# Patient Record
Sex: Female | Born: 1965 | Race: White | Hispanic: No | Marital: Married | Smoking: Former smoker
Health system: Southern US, Community
[De-identification: ages and names within clinical notes are randomized; demographics above are authoritative.]

## PROBLEM LIST (undated history)

## (undated) DIAGNOSIS — F419 Anxiety disorder, unspecified: Secondary | ICD-10-CM

## (undated) DIAGNOSIS — E119 Type 2 diabetes mellitus without complications: Secondary | ICD-10-CM

## (undated) HISTORY — PX: BICEPS TENDON REPAIR: SHX566

## (undated) HISTORY — PX: HERNIA REPAIR: SHX51

## (undated) HISTORY — PX: CHOLECYSTECTOMY: SHX55

## (undated) HISTORY — PX: COLONOSCOPY: SHX174

## (undated) HISTORY — PX: ROTATOR CUFF REPAIR: SHX139

## (undated) HISTORY — PX: GASTRIC BYPASS: SHX52

---

## 2017-03-03 ENCOUNTER — Ambulatory Visit (INDEPENDENT_AMBULATORY_CARE_PROVIDER_SITE_OTHER): Payer: BLUE CROSS/BLUE SHIELD | Admitting: Orthopedic Surgery

## 2017-03-03 ENCOUNTER — Encounter (INDEPENDENT_AMBULATORY_CARE_PROVIDER_SITE_OTHER): Payer: Self-pay | Admitting: Orthopedic Surgery

## 2017-03-03 DIAGNOSIS — M79601 Pain in right arm: Secondary | ICD-10-CM | POA: Diagnosis not present

## 2017-03-03 MED ORDER — GABAPENTIN 100 MG PO CAPS: 100.0000 mg | ORAL_CAPSULE | Freq: Two times a day (BID) | ORAL | 0 refills | Status: DC

## 2017-03-03 MED ORDER — TRAMADOL HCL 50 MG PO TABS: 50.0000 mg | ORAL_TABLET | Freq: Two times a day (BID) | ORAL | 0 refills | Status: DC

## 2017-03-03 NOTE — Progress Notes (Signed)
Office Visit Note   Patient: Morgan Owens           Date of Birth: 03/04/1966           MRN: [id number] Visit Date: 03/03/2017 Requested by: No referring provider defined for this encounter. PCP: System, Pcp Not In  Subjective: Chief Complaint  Patient presents with  . Arm Pain    right arm    HPI: Morgan Manchesterraci is a 51 year old patient with right arm pain.  She reports numbness and tingling in all of her fingers.  Reports weakness in the hand is well including radiating arm pain.  Been going on for several weeks.  She tried a wrist splint without much relief.  She also tried ibuprofen Tylenol and Aleve without relief.:  Makes it worse but she does get some relief with heat.  She does have a history of tennis elbow but she states this doesn't feel like tennis elbow.  Putting her arm overhead gives her relief.  She has tenderness under her arm as well.  The pain makes her nauseated at times.  She describes primarily palmar sided numbness and tingling and doesn't really report any discrete neck pain.  She works as a Estate agentforklift operator.              ROS: All systems reviewed are negative as they relate to the chief complaint within the history of present illness.  Patient denies  fevers or chills.   Assessment & Plan: Visit Diagnoses:  1. Right arm pain     Plan: Impression is right sided radiculopathy which may be either carpal tunnel, cubital tunnel syndrome, or radicular pain from a far lateral disc herniation in her neck.  Plan at this time is to try her on Ultram twice a day #30 with no refills as well as Neurontin.  I informed her that was an off label use.  Refer to Morgan Owens for nerve conduction study to evaluate for carpal tunnel syndrome versus cubital tunnel syndrome versus cervical radiculopathy.  I'll see her back after those studies.  Follow-Up Instructions: No Follow-up on file.   Orders:  Orders Placed This Encounter  Procedures  . Ambulatory referral to Physical  Medicine Rehab   Meds ordered this encounter  Medications  . traMADol (ULTRAM) 50 MG tablet    Sig: Take 1 tablet (50 mg total) by mouth 2 (two) times daily.    Dispense:  30 tablet    Refill:  0  . gabapentin (NEURONTIN) 100 MG capsule    Sig: Take 1 capsule (100 mg total) by mouth 2 (two) times daily.    Dispense:  30 capsule    Refill:  0      Procedures: No procedures performed   Clinical Data: No additional findings.  Objective: Vital Signs: There were no vitals taken for this visit.  Physical Exam:   Constitutional: Patient appears well-developed HEENT:  Head: Normocephalic Eyes:EOM are normal Neck: Normal range of motion Cardiovascular: Normal rate Pulmonary/chest: Effort normal Neurologic: Patient is alert Skin: Skin is warm Psychiatric: Patient has normal mood and affect    Ortho Exam: Orthopedic exam demonstrates good cervical spine range of motion she does have some paresthesias in the ulnar distribution right versus left.  Positive Tinel's in the cubital tunnel and right versus left.  No subluxation of the ulnar nerve however with elbow flexion.  Radial pulses intact bilaterally.  Reflexes symmetric bilateral biceps and triceps 1+ out of 4.  She has pretty good  interosseous wrist flexion and wrist extension biceps triceps and deltoid strength but no wasting of the interosseous muscles.  Her interosseous strength however is slightly weaker on the right compared to the left.  Negative carpal tunnel compression testing bilaterally.  Specialty Comments:  No specialty comments available.  Imaging: No results found.   PMFS History: There are no active problems to display for this patient.  No past medical history on file.  No family history on file.  No past surgical history on file. Social History   Occupational History  . Not on file.   Social History Main Topics  . Smoking status: Not on file  . Smokeless tobacco: Not on file  . Alcohol use Not  on file  . Drug use: Unknown  . Sexual activity: Not on file

## 2017-03-22 ENCOUNTER — Ambulatory Visit (INDEPENDENT_AMBULATORY_CARE_PROVIDER_SITE_OTHER): Payer: BLUE CROSS/BLUE SHIELD | Admitting: Physical Medicine and Rehabilitation

## 2017-03-22 ENCOUNTER — Encounter (INDEPENDENT_AMBULATORY_CARE_PROVIDER_SITE_OTHER): Payer: Self-pay | Admitting: Physical Medicine and Rehabilitation

## 2017-03-22 DIAGNOSIS — M5412 Radiculopathy, cervical region: Secondary | ICD-10-CM

## 2017-03-22 DIAGNOSIS — R202 Paresthesia of skin: Secondary | ICD-10-CM | POA: Diagnosis not present

## 2017-03-22 NOTE — Progress Notes (Deleted)
Right upper extremity, tingling, middle and ring finger lock up, pain in forearm, hanging down makes it really go numb and tingling.  Using heating pads as it is the only thin that gives her relief. Right handed, No lotions

## 2017-03-22 NOTE — Progress Notes (Signed)
Owens Owens SaChambers Miller - 51 y.o. female MRN [id number]  Date of birth: 07/12/1966  Office Visit Note: Visit Date: 03/22/2017 PCP: System, Pcp Not In Referred by: No ref. provider found  Subjective: Chief Complaint  Patient presents with  . Right Arm - Pain, Numbness   HPI: Ms. Owens Owens is a 51 year old right-hand dominant female reporting several weeks of pain and numbness in the right arm essentially in all the digits but when he really get her to describe the symptoms in the hand is in the middle 2 digits. She does not endorse much in the way of neck pain. It does seem to radiate from the arm down to the hand. She does get worsening with certain positions and at night. She gets some relief she raises her arm above her head. She gets worsening symptoms with her arm hanging down. Anti-inflammatories without much relief. She initially tried a carpal tunnel brace without relief. She recently saw Dr. August Saucerean who ordered electrodiagnostic studies. She's not had prior electrodiagnostic studies. She's had no prior cervical MRI. She has had some lumbar spine problems with more recent lumbar spine MRI. She denies any symptoms on the left. She reports using heating pads. She started the tramadol and gabapentin without much relief. She works as a Estate agentforklift operator.    ROS Otherwise per HPI.  Assessment & Plan: Visit Diagnoses:  1. Paresthesia of skin   2. Cervical radiculopathy     Plan: No additional findings.  Impression: The above electrodiagnostic study is ABNORMAL and reveals evidence of moderate subacute on chronic C7 radiculopathy on the right.    There is also  evidence of a mild to almost moderate right median nerve entrapment at the wrist (carpal tunnel syndrome) affecting sensory components. This essentially represents a double crush phenomenon.  There is no significant electrodiagnostic evidence of any other focal nerve entrapment, brachial plexopathy or generalized peripheral  neuropathy.   Recommendations: 1.  Follow-up with referring physician. 2.  Continue current management of symptoms. 3.  Suggest MRI of the cervical spine.    Meds & Orders: No orders of the defined types were placed in this encounter.   Orders Placed This Encounter  Procedures  . MR CERVICAL SPINE WO CONTRAST  . NCV with EMG (electromyography)    Follow-up: Return for MRI review after completion.   Procedures: No procedures performed  EMG & NCV Findings: Evaluation of the right median (across palm) sensory nerve showed prolonged distal peak latency (Wrist, 4.1 ms).  All remaining nerves (as indicated in the following tables) were within normal limits.    Needle evaluation of the right first dorsal interosseous muscle showed increased insertional activity and slightly increased spontaneous activity.  The right extensor digitorum communis and the right pronator teres muscles showed increased insertional activity, increased spontaneous activity, and diminished recruitment.  The right triceps muscle showed increased insertional activity and diminished recruitment.  All remaining muscles (as indicated in the following table) showed no evidence of electrical instability.    Impression: The above electrodiagnostic study is ABNORMAL and reveals evidence of moderate subacute on chronic C7 radiculopathy on the right.    There is also  evidence of a mild to almost moderate right median nerve entrapment at the wrist (carpal tunnel syndrome) affecting sensory components. This essentially represents a double crush phenomenon.  There is no significant electrodiagnostic evidence of any other focal nerve entrapment, brachial plexopathy or generalized peripheral neuropathy.   Recommendations: 1.  Follow-up with referring physician. 2.  Continue  current management of symptoms. 3.  Suggest MRI of the cervical spine.    Nerve Conduction Studies Anti Sensory Summary Table   Stim Site NR Peak (ms)  Norm Peak (ms) P-T Amp (V) Norm P-T Amp Site1 Site2 Delta-P (ms) Dist (cm) Vel (m/s) Norm Vel (m/s)  Right Median Acr Palm Anti Sensory (2nd Digit)  33.1C  Wrist    *4.1 <3.6 18.9 >10 Wrist Palm 2.3 0.0    Palm    1.8 <2.0 5.0         Right Radial Anti Sensory (Base 1st Digit)  32.6C  Wrist    1.7 <3.1 21.8  Wrist Base 1st Digit 1.7 0.0    Right Ulnar Anti Sensory (5th Digit)  33C  Wrist    3.2 <3.7 25.4 >15.0 Wrist 5th Digit 3.2 14.0 44 >38   Motor Summary Table   Stim Site NR Onset (ms) Norm Onset (ms) O-P Amp (mV) Norm O-P Amp Site1 Site2 Delta-0 (ms) Dist (cm) Vel (m/s) Norm Vel (m/s)  Right Median Motor (Abd Poll Brev)  32.6C  Wrist    4.2 <4.2 5.5 >5 Elbow Wrist 4.2 21.0 50 >50  Elbow    8.4  4.7         Right Ulnar Motor (Abd Dig Min)  32.7C  Wrist    3.2 <4.2 9.4 >3 B Elbow Wrist 3.5 20.0 57 >53  B Elbow    6.7  8.6  A Elbow B Elbow 1.5 9.5 63 >53  A Elbow    8.2  8.1          EMG   Side Muscle Nerve Root Ins Act Fibs Psw Amp Dur Poly Recrt Int Dennie Bible Comment  Right 1stDorInt Ulnar C8-T1 *CRD *1+ *1+ Nml Nml 0 Nml Nml   Right Abd Poll Brev Median C8-T1 Nml Nml Nml Nml Nml 0 Nml Nml   Right ExtDigCom   *Incr *3+ *3+ Nml Nml 0 *Reduced Nml   Right Triceps Radial C6-7-8 *Incr Nml Nml Nml Nml 0 *Reduced Nml   Right Deltoid Axillary C5-6 Nml Nml Nml Nml Nml 0 Nml Nml   Right PronatorTeres Median C6-7 *Incr *3+ *3+ Nml Nml 0 *Reduced Nml     Nerve Conduction Studies Anti Sensory Left/Right Comparison   Stim Site L Lat (ms) R Lat (ms) L-R Lat (ms) L Amp (V) R Amp (V) L-R Amp (%) Site1 Site2 L Vel (m/s) R Vel (m/s) L-R Vel (m/s)  Median Acr Palm Anti Sensory (2nd Digit)  33.1C  Wrist  *4.1   18.9  Wrist Palm     Palm  1.8   5.0        Radial Anti Sensory (Base 1st Digit)  32.6C  Wrist  1.7   21.8  Wrist Base 1st Digit     Ulnar Anti Sensory (5th Digit)  33C  Wrist  3.2   25.4  Wrist 5th Digit  44    Motor Left/Right Comparison   Stim Site L Lat (ms) R Lat (ms) L-R  Lat (ms) L Amp (mV) R Amp (mV) L-R Amp (%) Site1 Site2 L Vel (m/s) R Vel (m/s) L-R Vel (m/s)  Median Motor (Abd Poll Brev)  32.6C  Wrist  4.2   5.5  Elbow Wrist  50   Elbow  8.4   4.7        Ulnar Motor (Abd Dig Min)  32.7C  Wrist  3.2   9.4  B Elbow Wrist  57  B Elbow  6.7   8.6  A Elbow B Elbow  63   A Elbow  8.2   8.1           Waveforms:            Clinical History: No specialty comments available.  She has an unknown smoking status. She has never used smokeless tobacco. No results for input(s): HGBA1C, LABURIC in the last 8760 hours.  Objective:  VS:  HT:    WT:   BMI:     BP:   HR: bpm  TEMP: ( )  RESP:  Physical Exam  Musculoskeletal:  Inspection reveals no atrophy of the bilateral APB or FDI or hand intrinsics. There is no swelling, color changes, allodynia or dystrophic changes. There is 5 out of 5 strength in the bilateral wrist extension, finger abduction and long finger flexion. There is subjective impaired sensation to light touch in the right C6 and C7 dermatome. There is a negative Tinel's test at the bilateral wrist and elbow. There is a negative Phalen's test bilaterally. There is a negative Hoffmann's test bilaterally.    Ortho Exam Imaging: No results found.  Past Medical/Family/Surgical/Social History: Medications & Allergies reviewed per EMR There are no active problems to display for this patient.  No past medical history on file. No family history on file. No past surgical history on file. Social History   Occupational History  . Not on file.   Social History Main Topics  . Smoking status: Unknown If Ever Smoked  . Smokeless tobacco: Never Used  . Alcohol use Not on file  . Drug use: Unknown  . Sexual activity: Not on file

## 2017-03-23 NOTE — Procedures (Signed)
EMG & NCV Findings: Evaluation of the right median (across palm) sensory nerve showed prolonged distal peak latency (Wrist, 4.1 ms).  All remaining nerves (as indicated in the following tables) were within normal limits.    Needle evaluation of the right first dorsal interosseous muscle showed increased insertional activity and slightly increased spontaneous activity.  The right extensor digitorum communis and the right pronator teres muscles showed increased insertional activity, increased spontaneous activity, and diminished recruitment.  The right triceps muscle showed increased insertional activity and diminished recruitment.  All remaining muscles (as indicated in the following table) showed no evidence of electrical instability.    Impression: The above electrodiagnostic study is ABNORMAL and reveals evidence of moderate subacute on chronic C7 radiculopathy on the right.    There is also  evidence of a mild to almost moderate right median nerve entrapment at the wrist (carpal tunnel syndrome) affecting sensory components. This essentially represents a double crush phenomenon.  There is no significant electrodiagnostic evidence of any other focal nerve entrapment, brachial plexopathy or generalized peripheral neuropathy.   Recommendations: 1.  Follow-up with referring physician. 2.  Continue current management of symptoms. 3.  Suggest MRI of the cervical spine.    Nerve Conduction Studies Anti Sensory Summary Table   Stim Site NR Peak (ms) Norm Peak (ms) P-T Amp (V) Norm P-T Amp Site1 Site2 Delta-P (ms) Dist (cm) Vel (m/s) Norm Vel (m/s)  Right Median Acr Palm Anti Sensory (2nd Digit)  33.1C  Wrist    *4.1 <3.6 18.9 >10 Wrist Palm 2.3 0.0    Palm    1.8 <2.0 5.0         Right Radial Anti Sensory (Base 1st Digit)  32.6C  Wrist    1.7 <3.1 21.8  Wrist Base 1st Digit 1.7 0.0    Right Ulnar Anti Sensory (5th Digit)  33C  Wrist    3.2 <3.7 25.4 >15.0 Wrist 5th Digit 3.2 14.0 44 >38    Motor Summary Table   Stim Site NR Onset (ms) Norm Onset (ms) O-P Amp (mV) Norm O-P Amp Site1 Site2 Delta-0 (ms) Dist (cm) Vel (m/s) Norm Vel (m/s)  Right Median Motor (Abd Poll Brev)  32.6C  Wrist    4.2 <4.2 5.5 >5 Elbow Wrist 4.2 21.0 50 >50  Elbow    8.4  4.7         Right Ulnar Motor (Abd Dig Min)  32.7C  Wrist    3.2 <4.2 9.4 >3 B Elbow Wrist 3.5 20.0 57 >53  B Elbow    6.7  8.6  A Elbow B Elbow 1.5 9.5 63 >53  A Elbow    8.2  8.1          EMG   Side Muscle Nerve Root Ins Act Fibs Psw Amp Dur Poly Recrt Int Dennie BiblePat Comment  Right 1stDorInt Ulnar C8-T1 *CRD *1+ *1+ Nml Nml 0 Nml Nml   Right Abd Poll Brev Median C8-T1 Nml Nml Nml Nml Nml 0 Nml Nml   Right ExtDigCom   *Incr *3+ *3+ Nml Nml 0 *Reduced Nml   Right Triceps Radial C6-7-8 *Incr Nml Nml Nml Nml 0 *Reduced Nml   Right Deltoid Axillary C5-6 Nml Nml Nml Nml Nml 0 Nml Nml   Right PronatorTeres Median C6-7 *Incr *3+ *3+ Nml Nml 0 *Reduced Nml     Nerve Conduction Studies Anti Sensory Left/Right Comparison   Stim Site L Lat (ms) R Lat (ms) L-R Lat (ms) L Amp (V) R  Amp (V) L-R Amp (%) Site1 Site2 L Vel (m/s) R Vel (m/s) L-R Vel (m/s)  Median Acr Palm Anti Sensory (2nd Digit)  33.1C  Wrist  *4.1   18.9  Wrist Palm     Palm  1.8   5.0        Radial Anti Sensory (Base 1st Digit)  32.6C  Wrist  1.7   21.8  Wrist Base 1st Digit     Ulnar Anti Sensory (5th Digit)  33C  Wrist  3.2   25.4  Wrist 5th Digit  44    Motor Left/Right Comparison   Stim Site L Lat (ms) R Lat (ms) L-R Lat (ms) L Amp (mV) R Amp (mV) L-R Amp (%) Site1 Site2 L Vel (m/s) R Vel (m/s) L-R Vel (m/s)  Median Motor (Abd Poll Brev)  32.6C  Wrist  4.2   5.5  Elbow Wrist  50   Elbow  8.4   4.7        Ulnar Motor (Abd Dig Min)  32.7C  Wrist  3.2   9.4  B Elbow Wrist  57   B Elbow  6.7   8.6  A Elbow B Elbow  63   A Elbow  8.2   8.1           Waveforms:

## 2017-04-02 ENCOUNTER — Ambulatory Visit
Admission: RE | Admit: 2017-04-02 | Discharge: 2017-04-02 | Disposition: A | Discharge status: Home / Self Care | Payer: Self-pay | Source: Ambulatory Visit | Attending: Physical Medicine and Rehabilitation | Admitting: Physical Medicine and Rehabilitation

## 2017-04-02 DIAGNOSIS — M5412 Radiculopathy, cervical region: Secondary | ICD-10-CM

## 2017-04-02 DIAGNOSIS — R202 Paresthesia of skin: Secondary | ICD-10-CM

## 2017-04-03 ENCOUNTER — Other Ambulatory Visit: Payer: Self-pay

## 2017-04-05 ENCOUNTER — Encounter (INDEPENDENT_AMBULATORY_CARE_PROVIDER_SITE_OTHER): Payer: Self-pay | Admitting: Orthopaedic Surgery

## 2017-04-05 ENCOUNTER — Ambulatory Visit (INDEPENDENT_AMBULATORY_CARE_PROVIDER_SITE_OTHER): Payer: BLUE CROSS/BLUE SHIELD

## 2017-04-05 ENCOUNTER — Ambulatory Visit (INDEPENDENT_AMBULATORY_CARE_PROVIDER_SITE_OTHER): Payer: BLUE CROSS/BLUE SHIELD | Admitting: Orthopaedic Surgery

## 2017-04-05 VITALS — BP 124/81 | HR 100 | Ht 68.0 in | Wt 211.0 lb

## 2017-04-05 DIAGNOSIS — M4802 Spinal stenosis, cervical region: Secondary | ICD-10-CM | POA: Diagnosis not present

## 2017-04-05 DIAGNOSIS — M542 Cervicalgia: Secondary | ICD-10-CM

## 2017-04-05 NOTE — Progress Notes (Signed)
Office Visit Note   Patient: Morgan Owens           Date of Birth: Dec 11, 1965           MRN: [id number] Visit Date: 04/05/2017              Requested by: No referring provider defined for this encounter. PCP: System, Pcp Not In   Assessment & Plan: Visit Diagnoses:  1. Cervical stenosis of spine   2. Neck pain     Plan: Patient has significant cervical stenosis with flattening of the cord down to 5 mm worse at C6-7 and also severe at 5 to 6 mm  at C5-6. Obtain a CT scan and see her back after the scan. I discussed with her likely need for corpectomy to be performed and fusion from C5 down to C7 with plate fixation. We'll see her back after the scan for review of degree of OPLL.   Follow-Up Instructions: after cervical CT scan.   Orders:  Orders Placed This Encounter  Procedures  . XR Cervical Spine 2 or 3 views  . CT CERVICAL SPINE WO CONTRAST   No orders of the defined types were placed in this encounter.     Procedures: No procedures performed   Clinical Data: No additional findings.   Subjective: Chief Complaint  Patient presents with  . Neck - Pain  . Right Arm - Numbness, Weakness    HPI patient has ongoing neck pain she's had some persistent problems with low back pain and was told she has some stenosis in the lumbar region. Electrical tests were abnormal and positive for moderate subacute on chronic C7 radiculopathy on the right. She also had some mild to moderate right carpal tunnel changes affecting sensory components.  Review of Systems  Constitutional: Negative for chills and diaphoresis.  HENT: Negative for ear discharge, ear pain and nosebleeds.   Eyes: Negative for discharge and visual disturbance.  Respiratory: Negative for cough, choking and shortness of breath.   Cardiovascular: Negative for chest pain and palpitations.  Gastrointestinal: Negative for abdominal distention and abdominal pain.  Endocrine: Negative for cold intolerance  and heat intolerance.  Genitourinary: Negative for flank pain and hematuria.  Musculoskeletal: Positive for back pain and neck pain.  Skin: Negative for rash and wound.  Neurological: Negative for seizures and speech difficulty.  Hematological: Negative for adenopathy. Does not bruise/bleed easily.  Psychiatric/Behavioral: Negative for agitation and suicidal ideas.   positive EMGs nerve conduction velocities to mild-to-moderate right carpal tunnel syndrome and subacute with chronic C7 radiculopathy on the right.   Objective: Vital Signs: BP 124/81   Pulse 100   Ht 5\' 8"  (1.727 m)   Wt 211 lb (95.7 kg)   BMI 32.08 kg/m   Physical Exam  Constitutional: She is oriented to person, place, and time. She appears well-developed.  HENT:  Head: Normocephalic.  Right Ear: External ear normal.  Left Ear: External ear normal.  Eyes: Pupils are equal, round, and reactive to light.  Neck: No tracheal deviation present. No thyromegaly present.  Cardiovascular: Normal rate.   Pulmonary/Chest: Effort normal.  Abdominal: Soft.  Neurological: She is alert and oriented to person, place, and time.  Skin: Skin is warm and dry.  Psychiatric: She has a normal mood and affect. Her behavior is normal.    Ortho Exam patient has brachial plexus tenderness on the right more than left. Positive Spurling on the right. No lower extremity hyperreflexia. Her nerve is stable on  the right and left without subluxation. No thenar or hyperthenar atrophy. No interosseous wasting. She has slight weakness interosseous on the right normal, left. Triceps are strong and symmetrical. Full flexion she lacks 3 finger breast and the chest. Some pain with the cervical extension. Rotator cuff testing deltoid biceps triceps wrist flexion extension are normal with testing. No lower extremity hyperreflexia. No clonus. Negative Hoffmann's test bilaterally. Negative Phalen's right and left.  Specialty Comments:  No specialty comments  available.  Imaging: Xr Cervical Spine 2 Or 3 Views  Result Date: 04/05/2017 AP lateral cervical spine shows significant narrowing and anterior posterior spurring at C5-6 and C6-7. There may be some calcification of posterior longitudinal ligament visualized. 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    PMFS History: Patient Active Problem List   Diagnosis Date Noted  . Cervical stenosis of spine 04/05/2017   No past medical history on file.  No family history on file.  No past surgical history on file. Social History   Occupational History  . Not on file.   Social History Main Topics  . Smoking status: Unknown If Ever Smoked  . Smokeless tobacco: Never Used  . Alcohol use Not on file  . Drug use: Unknown  . Sexual activity: Not on file

## 2017-04-06 ENCOUNTER — Ambulatory Visit
Admission: RE | Admit: 2017-04-06 | Discharge: 2017-04-06 | Disposition: A | Discharge status: Home / Self Care | Payer: BLUE CROSS/BLUE SHIELD | Source: Ambulatory Visit | Attending: Orthopaedic Surgery | Admitting: Orthopaedic Surgery

## 2017-04-06 DIAGNOSIS — M4802 Spinal stenosis, cervical region: Secondary | ICD-10-CM

## 2017-04-07 ENCOUNTER — Telehealth (INDEPENDENT_AMBULATORY_CARE_PROVIDER_SITE_OTHER): Payer: Self-pay | Admitting: Orthopaedic Surgery

## 2017-04-07 ENCOUNTER — Ambulatory Visit (INDEPENDENT_AMBULATORY_CARE_PROVIDER_SITE_OTHER): Payer: BLUE CROSS/BLUE SHIELD | Admitting: Orthopedic Surgery

## 2017-04-07 NOTE — Telephone Encounter (Signed)
Please advise. CT was completed yesterday.

## 2017-04-07 NOTE — Telephone Encounter (Signed)
Pt had a CT scan done and wanting to see if the results came back and what the next steps will be. Please give pt a call.

## 2017-04-07 NOTE — Telephone Encounter (Signed)
Get her in Tuesday or Wed or Thursday to review thanks

## 2017-04-08 NOTE — Telephone Encounter (Signed)
appt made

## 2017-04-12 ENCOUNTER — Ambulatory Visit (INDEPENDENT_AMBULATORY_CARE_PROVIDER_SITE_OTHER): Payer: BLUE CROSS/BLUE SHIELD | Admitting: Orthopaedic Surgery

## 2017-04-12 ENCOUNTER — Encounter (INDEPENDENT_AMBULATORY_CARE_PROVIDER_SITE_OTHER): Payer: Self-pay | Admitting: Orthopaedic Surgery

## 2017-04-12 VITALS — BP 108/77 | HR 98 | Ht 68.0 in | Wt 211.0 lb

## 2017-04-12 DIAGNOSIS — G5601 Carpal tunnel syndrome, right upper limb: Secondary | ICD-10-CM

## 2017-04-12 DIAGNOSIS — M488X2 Other specified spondylopathies, cervical region: Secondary | ICD-10-CM

## 2017-04-12 DIAGNOSIS — M5020 Other cervical disc displacement, unspecified cervical region: Secondary | ICD-10-CM

## 2017-04-12 DIAGNOSIS — M4802 Spinal stenosis, cervical region: Secondary | ICD-10-CM | POA: Diagnosis not present

## 2017-04-12 NOTE — Progress Notes (Signed)
Office Visit Note   Patient: Morgan Owens           Date of Birth: 11/06/1965           MRN: [id number] Visit Date: 04/12/2017              Requested by: No referring provider defined for this encounter. PCP: System, Pcp Not In   Assessment & Plan: Visit Diagnoses:  1. Cervical stenosis of spine   2. 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 Carpal tunnel syndrome, right upper limb   4. Cervical disc herniation            Right foraminal at C7-T1  Plan: Patient has complex problem with ossification of the posterior longitudinal ligament was severe cord compression at C5-6 C6-7. In addition there some disc extrusion the foramina on the right at C7-T1. She also has carpal tunnel syndrome on the right with a double crush syndrome likely related to the disc is C7-T1 as well as the carpal tunnel compression. We had a long discussion with patient concerning for cord compression with abnormal cord signal. She understands that she may require a posterior procedure after an initial anterior decompression surgery for the severe stenosis with cord abnormality. Plan would be discectomies at C5-6 C6-7, lobectomy at C6 with strut allograft either fibula or iliac crest and anterior plating from C5-C7. Carpal tunnel release will be done at the same time on the right hand. We discussed that if she has continued symptoms does not improve then foraminotomy on the right at C 7-T1 with posterior instrumentation and fusion to include the anterior lobe was fused may be required. We discussed postoperative use of a hard plastic collar. Risks of surgery as was discussed including potential for persistent symptoms due to the cord abnormality even after decompression surgery. Risk of dural tear with spinal fluid leakage, pseudoarthrosis, need for posterior fusion, dysphagia and dysphonia were discussed in detail. Reviewed the CT scan as well as the MRI scan I gave her copies of  the port once again. Patient understands planned procedure and agrees to proceed.  Follow-Up Instructions: No Follow-up on file.   Orders:  No orders of the defined types were placed in this encounter.  No orders of the defined types were placed in this encounter.     Procedures: No procedures performed   Clinical Data: No additional findings.   Subjective: Chief Complaint  Patient presents with  . Results    CT neck    HPI 51 year old female returns with ongoing neck pain, right hand numbness and weakness, EMG changes for chronic C7 radiculopathy on the right and moderate carpal tunnel syndrome on the right. She complains of severe pain with neck flexion that radiates  down to the lumbosacral junction. She's noted dropping objects with the right hand. Pain radiates the radial side of her hand involving the radial 3-1/2 fingers. Patient works as a Estate agentforklift operator. She's taken anti-inflammatories, muscle relaxants, Neurontin without relief. Patient is a diabetic on Amaryl and metformin. She's noticed increase in her symptoms the last 2 months with progressive weakness of her right arm and hand. MRI scan 04/02/2017 showed severe 2 level cervical stenosis at C5-6 and C6-7 with disc protrusion, partial calcification of the disc cord flattening and compression narrowing down to 5 mm at C5-6 and 6 mm at C6-7 with abnormal cord signal in both levels. CT scan confirmed ossification of posterior longitudinal ligament with compression behind C6.  Patient has her central foraminal extrusion at C7-T1 on the right side.  Review of Systems  Constitutional: Negative for chills and diaphoresis.  HENT: Negative for ear discharge, ear pain and nosebleeds.   Eyes: Negative for discharge and visual disturbance.  Respiratory: Negative for cough, choking and shortness of breath.   Cardiovascular: Negative for chest pain and palpitations.  Gastrointestinal: Negative for abdominal distention and abdominal  pain.  Endocrine: Negative for cold intolerance and heat intolerance.  Genitourinary: Negative for flank pain and hematuria.  Musculoskeletal: Positive for neck pain.       Positive for right carpal tunnel compression by EMGs nerve conduction velocities with acute on chronic C7 changes by EMGs. Neck pain and right arm and hand weakness 5 months with progression over the last 2 months.  Skin: Negative for rash and wound.  Neurological: Negative for seizures and speech difficulty.  Hematological: Negative for adenopathy. Does not bruise/bleed easily.  Psychiatric/Behavioral: Negative for agitation and suicidal ideas.   positive for severe cervical stenosis with ossification of posterior longitudinal ligament and abnormal cord signal at C5-6 and C6-7, right C7-T1 lateral and foraminal disc extrusion.   Objective: Vital Signs: BP 108/77   Pulse 98   Ht 5\' 8"  (1.727 m)   Wt 211 lb (95.7 kg)   BMI 32.08 kg/m   Physical Exam  Constitutional: She is oriented to person, place, and time. She appears well-developed.  HENT:  Head: Normocephalic.  Right Ear: External ear normal.  Left Ear: External ear normal.  Eyes: Pupils are equal, round, and reactive to light.  Neck: No tracheal deviation present. No thyromegaly present.  Cardiovascular: Normal rate.   Pulmonary/Chest: Effort normal.  Abdominal: Soft.  Neurological: She is alert and oriented to person, place, and time.  Skin: Skin is warm and dry.  Psychiatric: She has a normal mood and affect. Her behavior is normal.    Ortho Exam patient has a positive Lhermitte test, positive Spurling on the right. Severe brachioplexus tenderness on the right. Shoulder stability no parascapular wasting negative lift off test subscap and impingement testing the right and left is normal. Triceps biceps are strong and symmetrical. Negative Hoffmann test bilaterally. Pain with carpal compression on the right. Negative Phalen's on the left Phalen's on the  right is positive at 45 seconds. No thenar or hyperthenar atrophy right or left. No lower extremity clonus. Lower extremity reflexes are 2+ she has a normal gait and no lower extremity clonus.  Specialty Comments:  No specialty comments available.  Imaging:Study Result   CLINICAL DATA:  Neck pain and RIGHT arm pain. C7 radiculopathy. Symptoms for 2 months. Weakness in RIGHT hand.  EXAM: MRI CERVICAL SPINE WITHOUT CONTRAST  TECHNIQUE: Multiplanar, multisequence MR imaging of the cervical spine was performed. No intravenous contrast was administered.  COMPARISON:  None.  FINDINGS: Alignment: Straightening of the normal cervical lordosis. There may be slight retrolisthesis at C5-6 and C6-7.  Vertebrae: Endplate reactive changes most prominent above and below C5-6. No worrisome osseous lesion.  Cord: Cord flattening with suspected abnormal cord signal at C6-7 on the RIGHT, possibly at C5-6.  Posterior Fossa, vertebral arteries, paraspinal tissues: No tonsillar herniation. Flow voids are maintained. No neck masses.  Disc levels:  C2-3:  Unremarkable.  C3-4: Central protrusion. Effacement anterior subarachnoid space with minimal cord flattening. No C4 foraminal narrowing.  C4-5:  Normal.  C5-6: Severe disc space narrowing. Central protrusion/extrusion appears focally pronounced, with marked T2 shortening suggestive of either calcified disc or ossification of  posterior longitudinal ligament. BILATERAL C6 foraminal narrowing. Significant cord flattening. Canal diameter 5-6 mm. Possible abnormal cord signal.  C6-7: Marked disc space narrowing. Central protrusion/ extrusion. Marked T2 shortening suggestive of calcified disc or ossification posterior longitudinal ligament. Marked cord flattening. Abnormal cord signal suspected on the RIGHT. BILATERAL C7 foraminal narrowing.  C7-T1: Central and rightward extrusion. RIGHT C8 foraminal narrowing. No significant  stenosis at this level.  IMPRESSION: The dominant RIGHT-sided abnormality is at C7-T1 where a central and rightward extrusion is observed. There is significant RIGHT C8 foraminal narrowing.  Severe two level spinal stenosis at C5-6 and C6-7, with either calcified central protrusion/ extrusions or ossification of the posterior longitudinal ligament. There is cord flattening with suspected abnormal cord signal, as well as BILATERAL C6 and C7 foraminal narrowing.  Consider CT of the cervical spine without contrast to evaluate for possible OPLL from C5 through C7. This could also be done in the setting of cervical myelography as clinically indicated.   Electronically Signed   By: Elsie Stain M.D.   On: 04/02/2017 15:01      CONTRAST  Study Result   CLINICAL DATA:  Cervical stenosis, neck pain, pain down the right arm for 2 months  EXAM: CT CERVICAL SPINE WITHOUT CONTRAST  TECHNIQUE: Multidetector CT imaging of the cervical spine was performed without intravenous contrast. Multiplanar CT image reconstructions were also generated.  COMPARISON:  04/02/2017 MR cervical spine  FINDINGS: Alignment: Normal.  Skull base and vertebrae: No acute fracture. No primary bone lesion or focal pathologic process.  Soft tissues and spinal canal: No prevertebral fluid or swelling. No visible canal hematoma.  Disc levels: Degenerative disc disease with disc height loss at C5-6 and C6-7. At C5-6 there is a broad-based disc osteophyte complex impressing on the thecal sac. Bilateral uncovertebral degenerative changes at C5-6 resulting in bilateral foraminal stenosis. Ossification of the posterior longitudinal ligament at C6-7 impressing on the thecal sac and narrowing the spinal canal. Right uncovertebral degenerative changes at C6-7 resulting in right foraminal stenosis. At C7-T1 there is a right paracentral/foraminal extrusion.  Upper chest: Lung apices are  clear.  Other: No fluid collection or hematoma.  IMPRESSION: 1. Degenerative disc disease with disc height loss at C5-6 and C6-7. Bilateral uncovertebral degenerative changes at C5-6 resulting in bilateral foraminal stenosis. Ossification of the posterior longitudinal ligament at C6-7 impressing on the thecal sac and narrowing the spinal canal. Right uncovertebral degenerative changes at C6-7 resulting in right foraminal stenosis. 2. At C7-T1 there is a right paracentral/foraminal extrusion.   Electronically Signed   By: Elige Ko   On: 04/06/2017 12:49     PMFS History: Patient Active Problem List   Diagnosis Date Noted  . Cervical stenosis of spine 04/05/2017   No past medical history on file.  No family history on file.  No past surgical history on file. Social History   Occupational History  . Not on file.   Social History Main Topics  . Smoking status: Unknown If Ever Smoked  . Smokeless tobacco: Never Used  . Alcohol use Not on file  . Drug use: Unknown  . Sexual activity: Not on file

## 2017-04-19 NOTE — Pre-Procedure Instructions (Signed)
Morgan Owens  04/19/2017      [facility] [id number] - [place] - [address] AT [facility] [address] [place] [id number] Phone: [phone] Fax: [phone]    Your procedure is scheduled on April 25, 2017.  [facility] Admitting at 100 PM.  Call this number if you have problems the morning of surgery:  [phone]   Remember:  Do not eat food or drink liquids after midnight.  Take these medicines the morning of surgery with A SIP OF WATER alprazolam (xanax), carisoprodol (soma).  7 days prior to surgery STOP taking any Aspirin, Aleve, Naproxen, Ibuprofen, Motrin, Advil, Goody's, BC's, all herbal medications, fish oil, and all vitamins     How to Manage Your Diabetes Before and After Surgery  Why is it important to control my blood sugar before and after surgery? . Improving blood sugar levels before and after surgery helps healing and can limit problems. . A way of improving blood sugar control is eating a healthy diet by: o  Eating less sugar and carbohydrates o  Increasing activity/exercise o  Talking with your doctor about reaching your blood sugar goals . High blood sugars (greater than 180 mg/dL) can raise your risk of infections and slow your recovery, so you will need to focus on controlling your diabetes during the weeks before surgery. . Make sure that the doctor who takes care of your diabetes knows about your planned surgery including the date and location.  How do I manage my blood sugar before surgery? . Check your blood sugar at least 4 times a day, starting 2 days before surgery, to make sure that the level is not too high or low. o Check your blood sugar the morning of your surgery when you wake up and every 2 hours until you get to the Short Stay unit. . If your blood sugar is less than 70 mg/dL, you will need to treat for low blood sugar: o Do not take  insulin. o Treat a low blood sugar (less than 70 mg/dL) with  cup of clear juice (cranberry or apple), 4 glucose tablets, OR glucose gel. o Recheck blood sugar in 15 minutes after treatment (to make sure it is greater than 70 mg/dL). If your blood sugar is not greater than 70 mg/dL on recheck, call [phone] for further instructions. . Report your blood sugar to the short stay nurse when you get to Short Stay.  . If you are admitted to the hospital after surgery: o Your blood sugar will be checked by the staff and you will probably be given insulin after surgery (instead of oral diabetes medicines) to make sure you have good blood sugar levels. o The goal for blood sugar control after surgery is 80-180 mg/dL.       WHAT DO I DO ABOUT MY DIABETES MEDICATION?   Marland Kitchen. Do not take oral diabetes medicines (pills) the morning of surgery glimepiride (amaryl), metformin (glucophage), dapagliflozin propanediol (farxiga) .   Marland Kitchen. The day of surgery, do not take other diabetes injectables, including Byetta (exenatide), Bydureon (exenatide ER), Victoza (liraglutide), or Trulicity (dulaglutide).  Reviewed and Endorsed by [facility] Patient Education Committee, August 2015   Do not wear jewelry, make-up or nail polish.  Do not wear lotions, powders, or perfumes, or deoderant.  Do not shave 48 hours prior to surgery.    Do not bring valuables to the hospital.  [facility] is not responsible for any belongings or valuables.  Contacts, dentures or bridgework may not be worn into surgery.  Leave your suitcase in the car.  After surgery it may be brought to your room.  For patients admitted to the hospital, discharge time will be determined by your treatment team.  Patients discharged the day of surgery will not be allowed to drive home.    Special instructions:   [place]- Preparing For Surgery  Before surgery, you can play an important role. Because skin is not sterile, your skin needs to be  as free of germs as possible. You can reduce the number of germs on your skin by washing with CHG (chlorahexidine gluconate) Soap before surgery.  CHG is an antiseptic cleaner which kills germs and bonds with the skin to continue killing germs even after washing.  Please do not use if you have an allergy to CHG or antibacterial soaps. If your skin becomes reddened/irritated stop using the CHG.  Do not shave (including legs and underarms) for at least 48 hours prior to first CHG shower. It is OK to shave your face.  Please follow these instructions carefully.   1. Shower the NIGHT BEFORE SURGERY and the MORNING OF SURGERY with CHG.   2. If you chose to wash your hair, wash your hair first as usual with your normal shampoo.  3. After you shampoo, rinse your hair and body thoroughly to remove the shampoo.  4. Use CHG as you would any other liquid soap. You can apply CHG directly to the skin and wash gently with a scrungie or a clean washcloth.   5. Apply the CHG Soap to your body ONLY FROM THE NECK DOWN.  Do not use on open wounds or open sores. Avoid contact with your eyes, ears, mouth and genitals (private parts). Wash genitals (private parts) with your normal soap.  6. Wash thoroughly, paying special attention to the area where your surgery will be performed.  7. Thoroughly rinse your body with warm water from the neck down.  8. DO NOT shower/wash with your normal soap after using and rinsing off the CHG Soap.  9. Pat yourself dry with a CLEAN TOWEL.   10. Wear CLEAN PAJAMAS   11. Place CLEAN SHEETS on your bed the night of your first shower and DO NOT SLEEP WITH PETS.    Day of Surgery: Do not apply any deodorants/lotions. Please wear clean clothes to the hospital/surgery center.     Please read over the following fact sheets that you were given. Pain Booklet, Coughing and Deep Breathing, MRSA Information and Surgical Site Infection Prevention

## 2017-04-20 ENCOUNTER — Encounter (HOSPITAL_COMMUNITY): Payer: Self-pay | Admitting: *Deleted

## 2017-04-20 ENCOUNTER — Encounter (HOSPITAL_COMMUNITY)
Admission: RE | Admit: 2017-04-20 | Discharge: 2017-04-20 | Disposition: A | Discharge status: Home / Self Care | Payer: BLUE CROSS/BLUE SHIELD | Source: Ambulatory Visit | Attending: Orthopaedic Surgery | Admitting: Orthopaedic Surgery

## 2017-04-20 DIAGNOSIS — G5601 Carpal tunnel syndrome, right upper limb: Secondary | ICD-10-CM | POA: Diagnosis not present

## 2017-04-20 DIAGNOSIS — I517 Cardiomegaly: Secondary | ICD-10-CM | POA: Insufficient documentation

## 2017-04-20 DIAGNOSIS — M4802 Spinal stenosis, cervical region: Secondary | ICD-10-CM | POA: Diagnosis not present

## 2017-04-20 DIAGNOSIS — Z01812 Encounter for preprocedural laboratory examination: Secondary | ICD-10-CM | POA: Insufficient documentation

## 2017-04-20 DIAGNOSIS — Z01810 Encounter for preprocedural cardiovascular examination: Secondary | ICD-10-CM | POA: Diagnosis present

## 2017-04-20 LAB — CBC
HEMATOCRIT: 46.5 % — AB (ref 36.0–46.0)
HEMOGLOBIN: 15.6 g/dL — AB (ref 12.0–15.0)
MCH: 29.8 pg (ref 26.0–34.0)
MCHC: 33.5 g/dL (ref 30.0–36.0)
MCV: 88.7 fL (ref 78.0–100.0)
Platelets: 162 10*3/uL (ref 150–400)
RBC: 5.24 MIL/uL — ABNORMAL HIGH (ref 3.87–5.11)
RDW: 13.5 % (ref 11.5–15.5)
WBC: 8.1 10*3/uL (ref 4.0–10.5)

## 2017-04-20 LAB — BASIC METABOLIC PANEL
ANION GAP: 10 (ref 5–15)
BUN: 6 mg/dL (ref 6–20)
CHLORIDE: 105 mmol/L (ref 101–111)
CO2: 23 mmol/L (ref 22–32)
Calcium: 9.4 mg/dL (ref 8.9–10.3)
Creatinine, Ser: 0.54 mg/dL (ref 0.44–1.00)
GFR calc Af Amer: >60 mL/min (ref >60)
GFR calc non Af Amer: >60 mL/min (ref >60)
GLUCOSE: 110 mg/dL — AB (ref 65–99)
POTASSIUM: 4.8 mmol/L (ref 3.5–5.1)
Sodium: 138 mmol/L (ref 135–145)

## 2017-04-20 LAB — HCG, SERUM, QUALITATIVE: PREG SERUM: NEGATIVE

## 2017-04-20 LAB — HEMOGLOBIN A1C
HEMOGLOBIN A1C: 6.9 % — AB (ref 4.8–5.6)
MEAN PLASMA GLUCOSE: 151.33 mg/dL

## 2017-04-20 LAB — SURGICAL PCR SCREEN
MRSA, PCR: NEGATIVE
Staphylococcus aureus: NEGATIVE

## 2017-04-20 LAB — GLUCOSE, CAPILLARY: Glucose-Capillary: 122 mg/dL — ABNORMAL HIGH (ref 65–99)

## 2017-04-20 NOTE — Progress Notes (Addendum)
PCP is Dr. Shirlean MylarSeth  [place]vins in [place] [phone] Denies ever seeing a cardiologist. Denies ever having a card cath, echo, or stress test.  Reports her fasting cbg's run 98-122 Denies a recent EKG.   Denies any Chest pain, shortness of breath, or fevers.   Request sent to Dr Annie SableIvins for Last office visit and EKG.

## 2017-04-21 NOTE — Progress Notes (Signed)
Anesthesia chart review: Patient is a 51 year old female posted for C5-6, C6-7 discectomy, C6 corpectomy, fusion C5-7 iliac crest, right carpal tunnel release 04/25/17 by Dr. Annell GreeningMark Yates. (Orders pending at PAT.)   History includes former smoker, DM2, anxiety, hernia repair, gastric bypass, cholecystectomy. BMI is consistent with obesity.  [place], [place]. She reported PCP as Dr. Shirlean MylarSeth Ivins in [place] [phone]).  Meds include Xanax, Abilify, Lipitor, Soma, Farxiga, Neurontin, Amaryl, metformin, Premarin.  Vitals: Temperature 36.9C. Heart rate 97. Respirations 16. Blood pressure 108/66. O2 sat 98%. CBG 122.  EKG 04/20/17: SR, occasional PVC versus fusion complex, possible LAE, LAD, LVH, cannot rule out septal infarct (age undetermined).  CT c-spine 04/02/17: IMPRESSION: 1. Degenerative disc disease with disc height loss at C5-6 and C6-7. Bilateral uncovertebral degenerative changes at C5-6 resulting in bilateral foraminal stenosis. Ossification of the posterior longitudinal ligament at C6-7 impressing on the thecal sac and narrowing the spinal canal. Right uncovertebral degenerative changes at C6-7 resulting in right foraminal stenosis. 2. At C7-T1 there is a right paracentral/foraminal extrusion.  Preoperative labs noted. Cr 0.54. H/H 15.6/46.5. Glucose 110. A1c 6.9. Serum pregnancy test negative.   She denied chest pain, SOB, fever. No known history of CAD or cardiology evaluation/testing. A1c < 7%. Last PCP records requested, but are still pending. If received, I asked Short Stay nursing staff to have anesthesia APP review; however, based on current information I anticipate that she can proceed as planned if no acute changes.  Velna Ochsllison Jamian Andujo, PA-C [facility] Short Stay Center/Anesthesiology Phone [phone] 04/21/2017 12:23 PM

## 2017-04-25 ENCOUNTER — Encounter (HOSPITAL_COMMUNITY): Payer: Self-pay | Admitting: *Deleted

## 2017-04-25 ENCOUNTER — Inpatient Hospital Stay (HOSPITAL_COMMUNITY)
Admission: AD | Admit: 2017-04-25 | Discharge: 2017-04-26 | DRG: 473 | Disposition: A | Discharge status: Home / Self Care | Payer: BLUE CROSS/BLUE SHIELD | Source: Ambulatory Visit | Attending: Orthopaedic Surgery | Admitting: Orthopaedic Surgery

## 2017-04-25 ENCOUNTER — Ambulatory Visit (HOSPITAL_COMMUNITY): Payer: BLUE CROSS/BLUE SHIELD | Admitting: Anesthesiology

## 2017-04-25 ENCOUNTER — Ambulatory Visit (HOSPITAL_COMMUNITY): Payer: BLUE CROSS/BLUE SHIELD

## 2017-04-25 ENCOUNTER — Ambulatory Visit (HOSPITAL_COMMUNITY): Payer: BLUE CROSS/BLUE SHIELD | Admitting: Vascular Surgery

## 2017-04-25 ENCOUNTER — Encounter (HOSPITAL_COMMUNITY)
Admission: AD | Disposition: A | Discharge status: Home / Self Care | Payer: Self-pay | Source: Ambulatory Visit | Attending: Orthopaedic Surgery

## 2017-04-25 DIAGNOSIS — M4802 Spinal stenosis, cervical region: Secondary | ICD-10-CM | POA: Diagnosis not present

## 2017-04-25 DIAGNOSIS — G5601 Carpal tunnel syndrome, right upper limb: Secondary | ICD-10-CM | POA: Diagnosis not present

## 2017-04-25 DIAGNOSIS — Z87891 Personal history of nicotine dependence: Secondary | ICD-10-CM

## 2017-04-25 DIAGNOSIS — Z9884 Bariatric surgery status: Secondary | ICD-10-CM | POA: Diagnosis not present

## 2017-04-25 DIAGNOSIS — M50122 Cervical disc disorder at C5-C6 level with radiculopathy: Secondary | ICD-10-CM | POA: Diagnosis present

## 2017-04-25 DIAGNOSIS — E119 Type 2 diabetes mellitus without complications: Secondary | ICD-10-CM | POA: Diagnosis present

## 2017-04-25 DIAGNOSIS — E669 Obesity, unspecified: Secondary | ICD-10-CM | POA: Diagnosis present

## 2017-04-25 DIAGNOSIS — M488X2 Other specified spondylopathies, cervical region: Secondary | ICD-10-CM | POA: Diagnosis present

## 2017-04-25 DIAGNOSIS — Z7984 Long term (current) use of oral hypoglycemic drugs: Secondary | ICD-10-CM | POA: Diagnosis not present

## 2017-04-25 DIAGNOSIS — Z419 Encounter for procedure for purposes other than remedying health state, unspecified: Secondary | ICD-10-CM

## 2017-04-25 DIAGNOSIS — Z6832 Body mass index (BMI) 32.0-32.9, adult: Secondary | ICD-10-CM

## 2017-04-25 DIAGNOSIS — Z79899 Other long term (current) drug therapy: Secondary | ICD-10-CM

## 2017-04-25 DIAGNOSIS — F419 Anxiety disorder, unspecified: Secondary | ICD-10-CM | POA: Diagnosis present

## 2017-04-25 HISTORY — DX: Type 2 diabetes mellitus without complications: E11.9

## 2017-04-25 HISTORY — PX: ANTERIOR CERVICAL DECOMP/DISCECTOMY FUSION: SHX1161

## 2017-04-25 HISTORY — PX: CARPAL TUNNEL RELEASE: SHX101

## 2017-04-25 HISTORY — DX: Anxiety disorder, unspecified: F41.9

## 2017-04-25 LAB — GLUCOSE, CAPILLARY
GLUCOSE-CAPILLARY: 102 mg/dL — AB (ref 65–99)
GLUCOSE-CAPILLARY: 202 mg/dL — AB (ref 65–99)
Glucose-Capillary: 116 mg/dL — ABNORMAL HIGH (ref 65–99)

## 2017-04-25 SURGERY — ANTERIOR CERVICAL DECOMPRESSION/DISCECTOMY FUSION 1 LEVEL
Anesthesia: General | Laterality: Right

## 2017-04-25 MED ORDER — DEXAMETHASONE SODIUM PHOSPHATE 10 MG/ML IJ SOLN
INTRAMUSCULAR | Status: DC | PRN
  Administered 2017-04-25: 5 mg via INTRAVENOUS

## 2017-04-25 MED ORDER — PHENOL 1.4 % MT LIQD
1.0000 | OROMUCOSAL | Status: DC | PRN
  Administered 2017-04-25: 1 via OROMUCOSAL

## 2017-04-25 MED ORDER — HYDROMORPHONE HCL 1 MG/ML IJ SOLN: 0.2500 mg | INTRAMUSCULAR | Status: DC | PRN

## 2017-04-25 MED ORDER — ROCURONIUM BROMIDE 10 MG/ML (PF) SYRINGE
PREFILLED_SYRINGE | INTRAVENOUS | Status: AC
  Filled 2017-04-25: qty 5

## 2017-04-25 MED ORDER — PROPOFOL 10 MG/ML IV BOLUS
INTRAVENOUS | Status: DC | PRN
  Administered 2017-04-25: 150 mg via INTRAVENOUS

## 2017-04-25 MED ORDER — 0.9 % SODIUM CHLORIDE (POUR BTL) OPTIME
TOPICAL | Status: DC | PRN
  Administered 2017-04-25: 1000 mL

## 2017-04-25 MED ORDER — FENTANYL CITRATE (PF) 100 MCG/2ML IJ SOLN
INTRAMUSCULAR | Status: DC | PRN
  Administered 2017-04-25 (×3): 50 ug via INTRAVENOUS
  Administered 2017-04-25: 150 ug via INTRAVENOUS
  Administered 2017-04-25 (×4): 50 ug via INTRAVENOUS

## 2017-04-25 MED ORDER — ALBUMIN HUMAN 5 % IV SOLN
INTRAVENOUS | Status: DC | PRN
  Administered 2017-04-25: 16:00:00 via INTRAVENOUS

## 2017-04-25 MED ORDER — METFORMIN HCL 500 MG PO TABS
1000.0000 mg | ORAL_TABLET | Freq: Two times a day (BID) | ORAL | Status: DC
  Administered 2017-04-26: 1000 mg via ORAL
  Filled 2017-04-25: qty 2

## 2017-04-25 MED ORDER — SUGAMMADEX SODIUM 200 MG/2ML IV SOLN
INTRAVENOUS | Status: DC | PRN
  Administered 2017-04-25: 200 mg via INTRAVENOUS

## 2017-04-25 MED ORDER — PHENYLEPHRINE HCL 10 MG/ML IJ SOLN
INTRAMUSCULAR | Status: DC | PRN
  Administered 2017-04-25 (×6): 80 ug via INTRAVENOUS

## 2017-04-25 MED ORDER — ATORVASTATIN CALCIUM 20 MG PO TABS
20.0000 mg | ORAL_TABLET | Freq: Every day | ORAL | Status: DC
  Administered 2017-04-25: 20 mg via ORAL
  Filled 2017-04-25: qty 1

## 2017-04-25 MED ORDER — ARIPIPRAZOLE 10 MG PO TABS
20.0000 mg | ORAL_TABLET | Freq: Every day | ORAL | Status: DC
  Administered 2017-04-25: 20 mg via ORAL
  Filled 2017-04-25 (×2): qty 2

## 2017-04-25 MED ORDER — LACTATED RINGERS IV SOLN
INTRAVENOUS | Status: DC
  Administered 2017-04-25 (×3): via INTRAVENOUS

## 2017-04-25 MED ORDER — ACETAMINOPHEN 325 MG PO TABS: 650.0000 mg | ORAL_TABLET | ORAL | Status: DC | PRN

## 2017-04-25 MED ORDER — MIDAZOLAM HCL 5 MG/5ML IJ SOLN
INTRAMUSCULAR | Status: DC | PRN
Start: 2017-04-25 — End: 2017-04-25
  Administered 2017-04-25: 2 mg via INTRAVENOUS

## 2017-04-25 MED ORDER — LIDOCAINE HCL (CARDIAC) 20 MG/ML IV SOLN
INTRAVENOUS | Status: DC | PRN
  Administered 2017-04-25: 100 mg via INTRAVENOUS

## 2017-04-25 MED ORDER — MIDAZOLAM HCL 2 MG/2ML IJ SOLN
INTRAMUSCULAR | Status: AC
  Filled 2017-04-25: qty 2

## 2017-04-25 MED ORDER — CEFAZOLIN SODIUM-DEXTROSE 1-4 GM/50ML-% IV SOLN
1.0000 g | Freq: Three times a day (TID) | INTRAVENOUS | Status: AC
  Administered 2017-04-25 – 2017-04-26 (×2): 1 g via INTRAVENOUS
  Filled 2017-04-25 (×2): qty 50

## 2017-04-25 MED ORDER — HYDROMORPHONE HCL 1 MG/ML IJ SOLN: 0.5000 mg | INTRAMUSCULAR | Status: DC | PRN

## 2017-04-25 MED ORDER — LIDOCAINE 2% (20 MG/ML) 5 ML SYRINGE
INTRAMUSCULAR | Status: AC
  Filled 2017-04-25: qty 5

## 2017-04-25 MED ORDER — KETOROLAC TROMETHAMINE 30 MG/ML IJ SOLN: 30.0000 mg | Freq: Once | INTRAMUSCULAR | Status: DC | PRN

## 2017-04-25 MED ORDER — SODIUM CHLORIDE 0.9 % IV SOLN
INTRAVENOUS | Status: DC
  Administered 2017-04-25: 20:00:00 via INTRAVENOUS

## 2017-04-25 MED ORDER — DOCUSATE SODIUM 100 MG PO CAPS
100.0000 mg | ORAL_CAPSULE | Freq: Two times a day (BID) | ORAL | Status: DC
  Administered 2017-04-25 – 2017-04-26 (×2): 100 mg via ORAL
  Filled 2017-04-25 (×2): qty 1

## 2017-04-25 MED ORDER — CEFAZOLIN SODIUM-DEXTROSE 2-4 GM/100ML-% IV SOLN
2.0000 g | INTRAVENOUS | Status: AC
  Administered 2017-04-25: 2 g via INTRAVENOUS

## 2017-04-25 MED ORDER — GLIMEPIRIDE 2 MG PO TABS
2.0000 mg | ORAL_TABLET | Freq: Every day | ORAL | Status: DC
  Administered 2017-04-25: 2 mg via ORAL
  Filled 2017-04-25: qty 1

## 2017-04-25 MED ORDER — SODIUM CHLORIDE 0.9 % IV SOLN
250.0000 mL | INTRAVENOUS | Status: DC
  Administered 2017-04-25: 250 mL via INTRAVENOUS

## 2017-04-25 MED ORDER — ESTROGENS CONJUGATED 0.45 MG PO TABS
0.4500 mg | ORAL_TABLET | Freq: Every day | ORAL | Status: DC
  Administered 2017-04-25: 0.45 mg via ORAL
  Filled 2017-04-25: qty 1

## 2017-04-25 MED ORDER — FENTANYL CITRATE (PF) 250 MCG/5ML IJ SOLN
INTRAMUSCULAR | Status: AC
  Filled 2017-04-25: qty 5

## 2017-04-25 MED ORDER — DEXAMETHASONE SODIUM PHOSPHATE 10 MG/ML IJ SOLN
INTRAMUSCULAR | Status: AC
  Filled 2017-04-25: qty 2

## 2017-04-25 MED ORDER — ONDANSETRON HCL 4 MG PO TABS: 4.0000 mg | ORAL_TABLET | Freq: Four times a day (QID) | ORAL | Status: DC | PRN

## 2017-04-25 MED ORDER — CANAGLIFLOZIN 100 MG PO TABS
100.0000 mg | ORAL_TABLET | Freq: Every day | ORAL | Status: DC
  Filled 2017-04-25: qty 1

## 2017-04-25 MED ORDER — PHENYLEPHRINE HCL 10 MG/ML IJ SOLN
INTRAVENOUS | Status: DC | PRN
  Administered 2017-04-25: 30 ug/min via INTRAVENOUS

## 2017-04-25 MED ORDER — PROPOFOL 10 MG/ML IV BOLUS
INTRAVENOUS | Status: AC
  Filled 2017-04-25: qty 20

## 2017-04-25 MED ORDER — ONDANSETRON HCL 4 MG/2ML IJ SOLN: 4.0000 mg | Freq: Four times a day (QID) | INTRAMUSCULAR | Status: DC | PRN

## 2017-04-25 MED ORDER — ALPRAZOLAM 0.5 MG PO TABS
0.5000 mg | ORAL_TABLET | Freq: Two times a day (BID) | ORAL | Status: DC | PRN
  Administered 2017-04-26: 0.5 mg via ORAL
  Filled 2017-04-25: qty 1

## 2017-04-25 MED ORDER — METHOCARBAMOL 1000 MG/10ML IJ SOLN
500.0000 mg | Freq: Four times a day (QID) | INTRAVENOUS | Status: DC | PRN
  Filled 2017-04-25: qty 5

## 2017-04-25 MED ORDER — ONDANSETRON HCL 4 MG/2ML IJ SOLN
INTRAMUSCULAR | Status: DC | PRN
  Administered 2017-04-25: 4 mg via INTRAVENOUS

## 2017-04-25 MED ORDER — ONDANSETRON HCL 4 MG/2ML IJ SOLN
INTRAMUSCULAR | Status: AC
  Filled 2017-04-25: qty 2

## 2017-04-25 MED ORDER — CHLORHEXIDINE GLUCONATE 4 % EX LIQD: 60.0000 mL | Freq: Once | CUTANEOUS | Status: DC

## 2017-04-25 MED ORDER — POLYETHYLENE GLYCOL 3350 17 G PO PACK: 17.0000 g | PACK | Freq: Every day | ORAL | Status: DC

## 2017-04-25 MED ORDER — OXYCODONE HCL 5 MG PO TABS
5.0000 mg | ORAL_TABLET | ORAL | Status: DC | PRN
  Administered 2017-04-25 – 2017-04-26 (×3): 10 mg via ORAL
  Filled 2017-04-25 (×3): qty 2

## 2017-04-25 MED ORDER — BUPIVACAINE-EPINEPHRINE (PF) 0.25% -1:200000 IJ SOLN
INTRAMUSCULAR | Status: DC | PRN
  Administered 2017-04-25: 10 mL via PERINEURAL
  Administered 2017-04-25: 5 mL

## 2017-04-25 MED ORDER — METHOCARBAMOL 500 MG PO TABS
500.0000 mg | ORAL_TABLET | Freq: Four times a day (QID) | ORAL | Status: DC | PRN
  Administered 2017-04-25 – 2017-04-26 (×2): 500 mg via ORAL
  Filled 2017-04-25 (×2): qty 1

## 2017-04-25 MED ORDER — SODIUM CHLORIDE 0.9% FLUSH
3.0000 mL | Freq: Two times a day (BID) | INTRAVENOUS | Status: DC
  Administered 2017-04-25: 3 mL via INTRAVENOUS

## 2017-04-25 MED ORDER — MENTHOL 3 MG MT LOZG: 1.0000 | LOZENGE | OROMUCOSAL | Status: DC | PRN

## 2017-04-25 MED ORDER — SUGAMMADEX SODIUM 200 MG/2ML IV SOLN
INTRAVENOUS | Status: AC
  Filled 2017-04-25: qty 2

## 2017-04-25 MED ORDER — ROCURONIUM BROMIDE 100 MG/10ML IV SOLN
INTRAVENOUS | Status: DC | PRN
  Administered 2017-04-25: 50 mg via INTRAVENOUS
  Administered 2017-04-25 (×2): 20 mg via INTRAVENOUS

## 2017-04-25 MED ORDER — MEPERIDINE HCL 25 MG/ML IJ SOLN: 6.2500 mg | INTRAMUSCULAR | Status: DC | PRN

## 2017-04-25 MED ORDER — SODIUM CHLORIDE 0.9% FLUSH
3.0000 mL | INTRAVENOUS | Status: DC | PRN
Start: 2017-04-25 — End: 2017-04-26

## 2017-04-25 MED ORDER — PROMETHAZINE HCL 25 MG/ML IJ SOLN: 6.2500 mg | INTRAMUSCULAR | Status: DC | PRN

## 2017-04-25 MED ORDER — SUGAMMADEX SODIUM 200 MG/2ML IV SOLN
INTRAVENOUS | Status: DC | PRN
Start: 2017-04-25 — End: 2017-04-25

## 2017-04-25 MED ORDER — HEMOSTATIC AGENTS (NO CHARGE) OPTIME
TOPICAL | Status: DC | PRN
  Administered 2017-04-25 (×2): 1 via TOPICAL

## 2017-04-25 MED ORDER — ACETAMINOPHEN 650 MG RE SUPP: 650.0000 mg | RECTAL | Status: DC | PRN

## 2017-04-25 SURGICAL SUPPLY — 64 items
BANDAGE ACE 4X5 VEL STRL LF (GAUZE/BANDAGES/DRESSINGS) ×3 IMPLANT
BENGAL MONO CAGE ×3 IMPLANT
BENZOIN TINCTURE PRP APPL 2/3 (GAUZE/BANDAGES/DRESSINGS) ×3 IMPLANT
BIT DRILL SKYLINE 12MM (BIT) ×2 IMPLANT
BLADE CLIPPER SURG (BLADE) IMPLANT
BNDG ESMARK 4X9 LF (GAUZE/BANDAGES/DRESSINGS) ×3 IMPLANT
BUR ROUND FLUTED 4 SOFT TCH (BURR) ×3 IMPLANT
COLLAR CERV LO CONTOUR FIRM DE (SOFTGOODS) ×3 IMPLANT
CORD BIPOLAR FORCEPS 12FT (ELECTRODE) ×3 IMPLANT
CORDS BIPOLAR (ELECTRODE) ×3 IMPLANT
COVER MAYO STAND STRL (DRAPES) ×9 IMPLANT
COVER SURGICAL LIGHT HANDLE (MISCELLANEOUS) ×3 IMPLANT
CRADLE DONUT ADULT HEAD (MISCELLANEOUS) ×3 IMPLANT
CUFF TOURNIQUET SINGLE 18IN (TOURNIQUET CUFF) ×3 IMPLANT
CUFF TOURNIQUET SINGLE 24IN (TOURNIQUET CUFF) IMPLANT
DRAPE C-ARM 42X72 X-RAY (DRAPES) ×3 IMPLANT
DRAPE EXTREMITY T 121X128X90 (DRAPE) ×3 IMPLANT
DRAPE HALF SHEET 40X57 (DRAPES) ×3 IMPLANT
DRAPE MICROSCOPE LEICA (MISCELLANEOUS) ×3 IMPLANT
DRILL BIT SKYLINE 12MM (BIT) ×1
DURAPREP 26ML APPLICATOR (WOUND CARE) ×3 IMPLANT
DURAPREP 6ML APPLICATOR 50/CS (WOUND CARE) ×3 IMPLANT
ELECT COATED BLADE 2.86 ST (ELECTRODE) ×3 IMPLANT
ELECT REM PT RETURN 9FT ADLT (ELECTROSURGICAL) ×3
ELECTRODE REM PT RTRN 9FT ADLT (ELECTROSURGICAL) ×2 IMPLANT
EVACUATOR 1/8 PVC DRAIN (DRAIN) ×3 IMPLANT
GAUZE SPONGE 4X4 12PLY STRL (GAUZE/BANDAGES/DRESSINGS) ×6 IMPLANT
GAUZE XEROFORM 1X8 LF (GAUZE/BANDAGES/DRESSINGS) IMPLANT
GLOVE BIOGEL PI IND STRL 8 (GLOVE) ×4 IMPLANT
GLOVE BIOGEL PI INDICATOR 8 (GLOVE) ×2
GLOVE ORTHO TXT STRL SZ7.5 (GLOVE) ×6 IMPLANT
GOWN STRL REUS W/ TWL LRG LVL3 (GOWN DISPOSABLE) ×2 IMPLANT
GOWN STRL REUS W/ TWL XL LVL3 (GOWN DISPOSABLE) ×2 IMPLANT
GOWN STRL REUS W/TWL 2XL LVL3 (GOWN DISPOSABLE) ×3 IMPLANT
GOWN STRL REUS W/TWL LRG LVL3 (GOWN DISPOSABLE) ×1
GOWN STRL REUS W/TWL XL LVL3 (GOWN DISPOSABLE) ×1
HEAD HALTER (SOFTGOODS) ×3 IMPLANT
KIT BASIN OR (CUSTOM PROCEDURE TRAY) ×3 IMPLANT
KIT ROOM TURNOVER OR (KITS) ×3 IMPLANT
MANIFOLD NEPTUNE II (INSTRUMENTS) ×3 IMPLANT
NEEDLE 25GX 5/8IN NON SAFETY (NEEDLE) ×3 IMPLANT
NS IRRIG 1000ML POUR BTL (IV SOLUTION) ×3 IMPLANT
PACK ORTHO CERVICAL (CUSTOM PROCEDURE TRAY) ×3 IMPLANT
PACK ORTHO EXTREMITY (CUSTOM PROCEDURE TRAY) ×3 IMPLANT
PAD ARMBOARD 7.5X6 YLW CONV (MISCELLANEOUS) ×6 IMPLANT
PATTIES SURGICAL .5 X.5 (GAUZE/BANDAGES/DRESSINGS) ×3 IMPLANT
PIN TEMP SKYLINE THREADED (PIN) ×3 IMPLANT
PLATE SKYLINE 38MM (Plate) ×3 IMPLANT
SCREW VARIABLE SELF TAP 12MM (Screw) ×12 IMPLANT
STOCKINETTE 4X48 STRL (DRAPES) ×3 IMPLANT
STRIP CLOSURE SKIN 1/2X4 (GAUZE/BANDAGES/DRESSINGS) ×3 IMPLANT
SUCTION FRAZIER HANDLE 10FR (MISCELLANEOUS)
SUCTION TUBE FRAZIER 10FR DISP (MISCELLANEOUS) IMPLANT
SURGIFLO W/THROMBIN 8M KIT (HEMOSTASIS) ×6 IMPLANT
SUT BONE WAX W31G (SUTURE) ×3 IMPLANT
SUT ETHILON 4 0 PS 2 18 (SUTURE) ×3 IMPLANT
SUT VIC AB 3-0 PS2 18 (SUTURE) ×1
SUT VIC AB 3-0 PS2 18XBRD (SUTURE) ×2 IMPLANT
SUT VIC AB 4-0 PS2 27 (SUTURE) ×3 IMPLANT
SYR BULB 3OZ (MISCELLANEOUS) IMPLANT
TOWEL OR 17X24 6PK STRL BLUE (TOWEL DISPOSABLE) ×3 IMPLANT
TOWEL OR 17X26 10 PK STRL BLUE (TOWEL DISPOSABLE) ×3 IMPLANT
TUBE CONNECTING 12X1/4 (SUCTIONS) IMPLANT
WATER STERILE IRR 1000ML POUR (IV SOLUTION) IMPLANT

## 2017-04-25 NOTE — Anesthesia Preprocedure Evaluation (Addendum)
Anesthesia Evaluation  Patient identified by MRN, date of birth, ID band Patient awake    Reviewed: Allergy & Precautions, NPO status , Patient's Chart, lab work & pertinent test results  Airway Mallampati: II       Dental no notable dental hx. (+) Teeth Intact   Pulmonary former smoker,    Pulmonary exam normal breath sounds clear to auscultation       Cardiovascular negative cardio ROS Normal cardiovascular exam Rhythm:Regular Rate:Normal     Neuro/Psych PSYCHIATRIC DISORDERS Anxiety    GI/Hepatic negative GI ROS, Neg liver ROS,   Endo/Other  diabetes, Type 2, Oral Hypoglycemic Agents  Renal/GU negative Renal ROS  negative genitourinary   Musculoskeletal   Abdominal (+) + obese,   Peds  Hematology   Anesthesia Other Findings   Reproductive/Obstetrics                            Anesthesia Physical Anesthesia Plan  ASA: II  Anesthesia Plan: General   Post-op Pain Management:    Induction: Intravenous  PONV Risk Score and Plan: 4 or greater and Ondansetron, Dexamethasone, Midazolam and Scopolamine patch - Pre-op  Airway Management Planned: Oral ETT  Additional Equipment:   Intra-op Plan:   Post-operative Plan: Extubation in OR  Informed Consent: I have reviewed the patients History and Physical, chart, labs and discussed the procedure including the risks, benefits and alternatives for the proposed anesthesia with the patient or authorized representative who has indicated his/her understanding and acceptance.   Dental advisory given  Plan Discussed with: CRNA and Surgeon  Anesthesia Plan Comments:         Anesthesia Quick Evaluation

## 2017-04-25 NOTE — Brief Op Note (Addendum)
04/25/2017  6:23 PM  PATIENT:  Morgan Owens  51 y.o. female  PRE-OPERATIVE DIAGNOSIS:  Right Carpal Tunnel Syndrome, Cervical Stenosis C5 to C7, Ossification Posterior Longitudinal Ligament  POST-OPERATIVE DIAGNOSIS:  Right Carpal Tunnel Syndrome, Cervical Stenosis C5 to C7, Ossification Posterior Longitudinal Ligament  PROCEDURE:  C6 corpectomy,  Partial corpectomy at C5 and C7 to remove stenotic calcified posterior longitudinal ligament  , fusion C5-C7 with Bengal Cage and local bone packed in the cage . Anterior plate Z6-X0 .  Microscope assisted.  Right Carpal tunnel release.  SURGEON:  Surgeon(s) and Role:    * Eldred Manges, MD - Primary  PHYSICIAN ASSISTANT: Zonia Kief PA-C medically necessary and present for the entire procedure except for closure and the carpal tunnel release.  ASSISTANTS: none   ANESTHESIA:   local and general  EBL:  Total I/O In: 2350 [I.V.:2100; IV Piggyback:250] Out: 410 [Blood:410]  BLOOD ADMINISTERED:none  DRAINS: One Hemovac neck   LOCAL MEDICATIONS USED:  MARCAINE     SPECIMEN:  No Specimen  DISPOSITION OF SPECIMEN:  N/A  COUNTS:  YES  TOURNIQUET:   Total Tourniquet Time Documented: Upper Arm (Right) - 6 minutes Total: Upper Arm (Right) - 6 minutes   DICTATION: .Reubin Milan Dictation  PLAN OF CARE: Admit for overnight observation  PATIENT DISPOSITION:  PACU - hemodynamically stable.   Delay start of Pharmacological VTE agent (>24hrs) due to surgical blood loss or risk of bleeding: Spine surgery no VTE chemical  prophylaxis only mechanical

## 2017-04-25 NOTE — Op Note (Signed)
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 Right carpal tunnel syndrome.  Postoperative diagnosis: Same  Procedure: C5-6 C6-7 anterior cervical discectomy. C6 corpectomy, removal of the calcified posterior longitudinal ligament. Bengal  32 mm monolithic cage with local bone, skyline 38 mm plate with screws. Right carpal tunnel release.  Surgeon: Annell Greening M.D.  Asst. Zonia Kief PA-C medically necessary and present for the of cervical procedure except for closure and medically necessary for the a decompression of the spinal cord and the instrumented fusion.  Anesthesia: Gen.  Implants the [employer] [place] skyline 30 mm plate 12 mm screws 4 monolithic cage 32 mm  Bengal.  Brief history this 51 year old female set progressive increase neck pain arm weakness and tingling in her legs. Workup demonstrated 5 mm canal with the ossification posterior longitudinal ligament extending from mid body C5 down to the top of C7. Patient had disc protrusions in addition and workup included MRI scan as well as myelogram CT scan. She had progressive symptoms over the last 6 months.  Procedure after induction general anesthesia intubation with the glide scope traction halter was applied to the neck without the weight. Paper tape was used to to the just noticed last skin allergy to pink tape that been applied to the patient preoperatively with the IV was placed in preoperative holding area. DuraPrep was used arms are tucked sides with yellow pads. Area squared with towels sterile skin marker sterile Melisande the head Betadine Steri-Drape third sheets and drapes. Timeout procedure was completed incision was made midline extending to the left. Undermining was performed underneath the longus Coley. Small vein was coagulated. Blunt dissection with carotid sheath contents lateral down to the level of the longus Coley underneath the omohyoid. Large spurs were  noted at C5-6 and C6-7. Short 25 needle placed C5-6 level confirmed with crosstable lateral C-arm image confirming appropriate level. Spurs removed operative microscope was brought in Clark curettes were used to follow the disc space. C5-6 disc space at significant narrowing. C6-7 discectomy was performed progressing down to the base and then C6 vertebral body was removed with Ron sugars using the trial sizer for appropriate width. Once adequate bone and then obtained for packing of the cage for the fusion the small hand osteotome was used for removing the bottom third of bottom half of the L5 vertebrae as well as top portion of C7 is bone was used for grafting and placement in the cages well obtained of the case. Posterior 1/5 vertebral body was part way with 4 mm bur. Posterior longitudinal ligament was calcified in the middle and the using 1 mm Kerrisons taken down the posterior body on the right side was performed in the pieces gradually teased off the dura using some titanium microdissection instruments and removal of the posterior cortex of C6 once it was freed up from the dura with 1-2 mm Kerrisons. These pieces of bone were also used for grafting. Drilled tube was round. There is 50% of the C7 left and 50% of C5 left which corresponded to the areas of compression. Bone was removed right and left gutters. Some surgical flow was used for hemostasis the trial measurements are made 32 mm cage was selected packed with the patient's bone countersunk 2 mm and then the plate was selected. The cage on the cephalad and was a little bit more posterior than ideal was countersunk 34 mm endplate was removed cage handle was attached and the cage was then repositioned in  appropriate position and then the plate applied. Cage was tight and on top the cage at each and the some remaining pieces of bone were packed for additional grafting. There is 14 mm wide near-complete corpectomy. Cage was countersunk 2 mm and then the the  skyline 30 mm plate was placed with 4 screws checked under fluoroscopy satisfactory position. Hemovac placement technique in line with skin incision closure platysma 3-0 Vicryl for Vicryl subcuticular closure tincture benzoin Steri-Strips 4 x 4's and Aspen collar. Table was rotated 90 arm table applied right arm have proximal tourniquet applied arm was prepped with DuraPrep wrapped an Esmarch tourniquet inflated. Using [employer] lines the thenar crease was followed with the skin incision. Palmar fascia was divided transverse carpal ligament was divided along the ulnar aspect of the median nerve. There was some moderate narrowing of the carpal canal. Fingertip is introduced proximally and distally make sure complete release was performed thenar motor branch to K the distal radial takeoff and was identified. Proximal arch was identified in the palmar fat was intact in the distal aspect of the forearm fascia was split along the ulnar aspect of the nerve to make sure there is no compression. Irrigation with saline solution for nylon closure Mark infiltration 5 mL in the skin 20 L 4 x 4's and Ace wrap was applied for postoperative dressing patient was transferred recovery room in stable condition.

## 2017-04-25 NOTE — Anesthesia Procedure Notes (Signed)
Procedure Name: Intubation Date/Time: 04/25/2017 2:59 PM Performed by: Faustino Congress Zuriah Bordas Pre-anesthesia Checklist: Patient identified, Emergency Drugs available, Suction available and Patient being monitored Patient Re-evaluated:Patient Re-evaluated prior to induction Oxygen Delivery Method: Circle System Utilized Preoxygenation: Pre-oxygenation with 100% oxygen Induction Type: IV induction Ventilation: Mask ventilation without difficulty and Oral airway inserted - appropriate to patient size Laryngoscope Size: Glidescope and 3 (elective glidescope due to cervical radiculopathy ) Tube type: Oral Tube size: 7.0 mm Number of attempts: 1 Airway Equipment and Method: Stylet and Oral airway Placement Confirmation: ETT inserted through vocal cords under direct vision,  positive ETCO2 and breath sounds checked- equal and bilateral Tube secured with: Tape Dental Injury: Teeth and Oropharynx as per pre-operative assessment

## 2017-04-25 NOTE — Interval H&P Note (Signed)
History and Physical Interval Note:  04/25/2017 1:58 PM  Morgan Owens  has presented today for surgery, with the diagnosis of Right Carpal Tunnel Syndrome, Cervical Stenosis C5 to C7, Ossification Posterior Longitudinal Ligament  The various methods of treatment have been discussed with the patient and family. After consideration of risks, benefits and other options for treatment, the patient has consented to  Procedure(s): C5-6, C6-7 Discectomy, C6 Corpectomy, Fusion C5-C7 Iliac Crest Allograft (N/A) RIGHT CARPAL TUNNEL RELEASE (Right) as a surgical intervention .  The patient's history has been reviewed, patient examined, no change in status, stable for surgery.  I have reviewed the patient's chart and labs.  Questions were answered to the patient's satisfaction.     Eldred Manges

## 2017-04-25 NOTE — H&P (Signed)
Morgan Owens is an 51 y.o. female.   Chief Complaint: neck pain, UE radiculopathy and right carpal tunnel syndrome  HPI: patient with hx of C5-7 stenosis/HNP and right CTS presents for surgical intervention.  Progressively worsening symptoms.  Failed conservative treatment.    Past Medical History:  Diagnosis Date  . Anxiety   . Diabetes mellitus without complication [facility])     Past Surgical History:  Procedure Laterality Date  . BICEPS TENDON REPAIR    . CESAREAN SECTION    . CHOLECYSTECTOMY    . COLONOSCOPY    . GASTRIC BYPASS    . HERNIA REPAIR    . ROTATOR CUFF REPAIR      Family History  Problem Relation Age of Onset  . Colon cancer Mother   . Heart disease Mother   . Diabetes Mother   . Diabetes Father   . Atrial fibrillation Father    Social History:  reports that she has quit smoking. She has never used smokeless tobacco. She reports that she does not drink alcohol or use drugs.  Allergies: No Known Allergies  Medications Prior to Admission  Medication Sig Dispense Refill  . ALPRAZolam (XANAX) 0.5 MG tablet Take 0.5 mg by mouth 2 (two) times daily as needed (for anxiety/sleep.).   2  . ARIPiprazole (ABILIFY) 20 MG tablet Take 20 mg by mouth at bedtime.   1  . atorvastatin (LIPITOR) 20 MG tablet Take 20 mg by mouth at bedtime.    . carisoprodol (SOMA) 350 MG tablet Take 350 mg by mouth 3 (three) times daily as needed for muscle spasms.    . Cyanocobalamin (NASCOBAL) 500 MCG/0.1ML SOLN Place 500 mcg into the nose every Sunday.    . dapagliflozin propanediol (FARXIGA) 10 MG TABS tablet Take 10 mg by mouth at bedtime.    Marland Kitchen glimepiride (AMARYL) 2 MG tablet Take 2 mg by mouth at bedtime.    . metFORMIN (GLUCOPHAGE) 500 MG tablet Take 1,000 mg by mouth 2 (two) times daily.    Marland Kitchen PREMARIN 0.45 MG tablet Take 0.45 mg by mouth at bedtime.   3  . gabapentin (NEURONTIN) 100 MG capsule Take 1 capsule (100 mg total) by mouth 2 (two) times daily. (Patient not taking:  Reported on 04/19/2017) 30 capsule 0  . traMADol (ULTRAM) 50 MG tablet Take 1 tablet (50 mg total) by mouth 2 (two) times daily. (Patient not taking: Reported on 04/19/2017) 30 tablet 0    Results for orders placed or performed during the hospital encounter of 04/25/17 (from the past 48 hour(s))  Glucose, capillary     Status: Abnormal   Collection Time: 04/25/17 12:24 PM  Result Value Ref Range   Glucose-Capillary 116 (H) 65 - 99 mg/dL   Comment 1 Notify RN    Comment 2 Document in Chart    No results found.  Review of Systems  Constitutional: Negative.   HENT: Negative.   Respiratory: Negative.   Cardiovascular: Negative.   Gastrointestinal: Negative.   Genitourinary: Negative.   Musculoskeletal: Positive for neck pain.  Skin: Negative.   Neurological: Positive for tingling.    Blood pressure 129/82, pulse (!) 107, temperature 97.7 F (36.5 C), temperature source Oral, resp. rate 18, height  (1.727 m), weight 212 lb (96.2 kg), SpO2 99 %. Physical Exam  Constitutional: She is oriented to person, place, and time. She appears well-developed. No distress.  HENT:  Head: Normocephalic and atraumatic.  Eyes: Pupils are equal, round, and reactive to light. EOM  are normal.  Neck:  Brachial plexus tenderness.   Respiratory: No respiratory distress.  GI: She exhibits no distension.  Musculoskeletal:  Ortho Exam patient has a positive Lhermitte test, positive Spurling on the right. Severe brachioplexus tenderness on the right. Shoulder stability no parascapular wasting negative lift off test subscap and impingement testing the right and left is normal. Triceps biceps are strong and symmetrical. Negative Hoffmann test bilaterally. Pain with carpal compression on the right. Negative Phalen's on the left Phalen's on the right is positive at 45 seconds. No thenar or hyperthenar atrophy right or left. No lower extremity clonus. Lower extremity reflexes are 2+ she has a normal gait and no  lower extremity clonus.  Neurological: She is alert and oriented to person, place, and time.  Skin: Skin is warm and dry.  Psychiatric: She has a normal mood and affect.     Assessment/Plan C5-7 stenosis/HNP, right carpal tunnel syndrome.    We will proceed with C5-6, C6-7 Discectomy, C6 Corpectomy, Fusion C5-C7 Iliac Crest Allograft and right carpal tunnel release as scheduled.  Surgical procedure along with possible risks and complications discussed.  All questions answered and wishes to proceed.     Zonia Kief, PA-C 04/25/2017, 12:40 PM

## 2017-04-25 NOTE — Transfer of Care (Signed)
Immediate Anesthesia Transfer of Care Note  Patient: Morgan Owens  Procedure(s) Performed: Procedure(s): C5-6, C6-7 Discectomy, C6 Corpectomy, Fusion C5-C7 Iliac Crest Allograft (N/A) RIGHT CARPAL TUNNEL RELEASE (Right)  Patient Location: PACU  Anesthesia Type:General  Level of Consciousness: awake, alert , oriented and patient cooperative  Airway & Oxygen Therapy: Patient Spontanous Breathing and Patient connected to nasal cannula oxygen  Post-op Assessment: Report given to RN and Post -op Vital signs reviewed and stable  Post vital signs: Reviewed and stable  Last Vitals:  Vitals:   04/20/17 0810 04/25/17 1221  BP: 108/66 129/82  Pulse: 97 (!) 107  Resp: 16 18  Temp: 36.9 C 36.5 C  SpO2: 98% 99%    Last Pain:  Vitals:   04/25/17 1230  TempSrc:   PainSc: 5       Patients Stated Pain Goal: 3 (04/25/17 1230)  Complications: No apparent anesthesia complications

## 2017-04-25 NOTE — Anesthesia Postprocedure Evaluation (Signed)
Anesthesia Post Note  Patient: Event organiser  Procedure(s) Performed: Procedure(s) (LRB): C5-6, C6-7 Discectomy, C6 Corpectomy, Fusion C5-C7 Iliac Crest Allograft (N/A) RIGHT CARPAL TUNNEL RELEASE (Right)     Patient location during evaluation: PACU Anesthesia Type: General Level of consciousness: awake and alert Pain management: pain level controlled Vital Signs Assessment: post-procedure vital signs reviewed and stable Respiratory status: spontaneous breathing, nonlabored ventilation, respiratory function stable and patient connected to nasal cannula oxygen Cardiovascular status: blood pressure returned to baseline and stable Postop Assessment: no apparent nausea or vomiting Anesthetic complications: no    Last Vitals:  Vitals:   04/25/17 1917 04/25/17 1930  BP: 129/84   Pulse: (!) 105 (!) 105  Resp: (!) 21 18  Temp:    SpO2: 95% 97%    Last Pain:  Vitals:   04/25/17 1845  TempSrc:   PainSc: 0-No pain                 Abbye Lao,W. EDMOND

## 2017-04-26 LAB — GLUCOSE, CAPILLARY: GLUCOSE-CAPILLARY: 146 mg/dL — AB (ref 65–99)

## 2017-04-26 MED ORDER — OXYCODONE-ACETAMINOPHEN 5-325 MG PO TABS: 1.0000 | ORAL_TABLET | ORAL | 0 refills | Status: DC | PRN

## 2017-04-26 NOTE — Progress Notes (Signed)
Patient alert and oriented, mae's well, voiding adequate amount of urine, swallowing without difficulty, no c/o pain at time of discharge. Patient discharged home with family. Script and discharged instructions given to patient. Patient and family stated understanding of instructions given. Patient has an appointment with Dr. Yates  

## 2017-04-26 NOTE — Progress Notes (Signed)
   Subjective: 1 Day Post-Op Procedure(s) (LRB): C5-6, C6-7 Discectomy, C6 Corpectomy, Fusion C5-C7 Iliac Crest Allograft (N/A) RIGHT CARPAL TUNNEL RELEASE (Right) Patient reports pain as mild.    Objective: Vital signs in last 24 hours: Temp:  [97.7 F (36.5 C)-98.8 F (37.1 C)] 98.7 F (37.1 C) (09/18 0401) Pulse Rate:  [100-122] 104 (09/18 0401) Resp:  [10-31] 19 (09/18 0401) BP: (100-137)/(68-101) 115/71 (09/18 0401) SpO2:  [88 %-99 %] 96 % (09/18 0401) Weight:  [212 lb (96.2 kg)] 212 lb (96.2 kg) (09/17 1221)  Intake/Output from previous day: 09/17 0701 - 09/18 0700 In: 3313.9 [P.O.:640; I.V.:2323.9; IV Piggyback:350] Out: 565 [Urine:150; Drains:5; Blood:410] Intake/Output this shift: No intake/output data recorded.  No results for input(s): HGB in the last 72 hours. No results for input(s): WBC, RBC, HCT, PLT in the last 72 hours. No results for input(s): NA, K, CL, CO2, BUN, CREATININE, GLUCOSE, CALCIUM in the last 72 hours. No results for input(s): LABPT, INR in the last 72 hours.  Neurologically intact Dg Cervical Spine 2 Or 3 Views  Result Date: 04/25/2017 CLINICAL DATA:  C5-7 ACDF with corpectomy EXAM: DG C-ARM 61-120 MIN; CERVICAL SPINE - 2-3 VIEW COMPARISON:  None. FINDINGS: Intraoperative fluoroscopic radiographs during C5-7 ACDF demonstrates surgical hardware in satisfactory position. IMPRESSION: Intraoperative fluoroscopic radiographs during C5-7 ACDF, as above. Electronically Signed   By: Charline Bills M.D.   On: 04/25/2017 18:15   Dg C-arm 1-60 Min  Result Date: 04/25/2017 CLINICAL DATA:  C5-7 ACDF with corpectomy EXAM: DG C-ARM 61-120 MIN; CERVICAL SPINE - 2-3 VIEW COMPARISON:  None. FINDINGS: Intraoperative fluoroscopic radiographs during C5-7 ACDF demonstrates surgical hardware in satisfactory position. IMPRESSION: Intraoperative fluoroscopic radiographs during C5-7 ACDF, as above. Electronically Signed   By: Charline Bills M.D.   On: 04/25/2017 18:15     Assessment/Plan: 1 Day Post-Op Procedure(s) (LRB): C5-6, C6-7 Discectomy, C6 Corpectomy, Fusion C5-C7 Iliac Crest Allograft (N/A) RIGHT CARPAL TUNNEL RELEASE (Right) Plan: discharge home. Office one week  Eldred Manges 04/26/2017, 7:34 AM

## 2017-04-26 NOTE — Progress Notes (Signed)
Orthopedic Tech Progress Note Patient Details:  Morgan Owens 03-23-1966 [id number]  Ortho Devices Type of Ortho Device: [place] cervical collar Ortho Device/Splint Interventions: Application   Saul Fordyce 04/26/2017, 9:23 AM

## 2017-04-26 NOTE — Discharge Instructions (Signed)
Keep collar on at all times. Change to shower collar with husband applying and do not move your neck when it is changed. See Dr. Ophelia Charter in one week. Call for leg weakness or if you have problems walking. No lifting / carrying more than 5 lbs.

## 2017-04-29 ENCOUNTER — Encounter (HOSPITAL_COMMUNITY): Payer: Self-pay | Admitting: Orthopaedic Surgery

## 2017-05-03 ENCOUNTER — Encounter (INDEPENDENT_AMBULATORY_CARE_PROVIDER_SITE_OTHER): Payer: Self-pay | Admitting: Orthopaedic Surgery

## 2017-05-03 ENCOUNTER — Ambulatory Visit (INDEPENDENT_AMBULATORY_CARE_PROVIDER_SITE_OTHER): Payer: BLUE CROSS/BLUE SHIELD | Admitting: Orthopaedic Surgery

## 2017-05-03 ENCOUNTER — Ambulatory Visit (INDEPENDENT_AMBULATORY_CARE_PROVIDER_SITE_OTHER): Payer: BLUE CROSS/BLUE SHIELD

## 2017-05-03 VITALS — BP 120/101 | HR 91

## 2017-05-03 DIAGNOSIS — Z981 Arthrodesis status: Secondary | ICD-10-CM

## 2017-05-03 DIAGNOSIS — M25531 Pain in right wrist: Secondary | ICD-10-CM | POA: Diagnosis not present

## 2017-05-03 MED ORDER — HYDROCODONE-ACETAMINOPHEN 5-325 MG PO TABS: 1.0000 | ORAL_TABLET | Freq: Four times a day (QID) | ORAL | 0 refills | Status: DC | PRN

## 2017-05-03 NOTE — Discharge Summary (Signed)
Patient ID: Morgan Owens MRN: [id number] DOB/AGE: [birth year] 52 y.o.  Admit date: 04/25/2017 Discharge date: 05/03/2017  Admission Diagnoses:  Active Problems:   Cervical spinal stenosis   Discharge Diagnoses:  Active Problems:   Cervical spinal stenosis  status post Procedure(s): C5-6, C6-7 Discectomy, C6 Corpectomy, Fusion C5-C7 Iliac Crest Allograft RIGHT CARPAL TUNNEL RELEASE  Past Medical History:  Diagnosis Date  . Anxiety   . Diabetes mellitus without complication (HCC)     Surgeries: Procedure(s): C5-6, C6-7 Discectomy, C6 Corpectomy, Fusion C5-C7 Iliac Crest Allograft RIGHT CARPAL TUNNEL RELEASE on 04/25/2017   Consultants:   Discharged Condition: Improved  Hospital Course: Morgan Owens is an 51 y.o. female who was admitted 04/25/2017 for operative treatment of <principal problem not specified>. Patient failed conservative treatments (please see the history and physical for the specifics) and had severe unremitting pain that affects sleep, daily activities and work/hobbies. After pre-op clearance, the patient was taken to the operating room on 04/25/2017 and underwent  Procedure(s): C5-6, C6-7 Discectomy, C6 Corpectomy, Fusion C5-C7 Iliac Crest Allograft RIGHT CARPAL TUNNEL RELEASE.    Patient was given perioperative antibiotics:  Anti-infectives    Start     Dose/Rate Route Frequency Ordered Stop   04/25/17 2200  ceFAZolin (ANCEF) IVPB 1 g/50 mL premix     1 g 100 mL/hr over 30 Minutes Intravenous Every 8 hours 04/25/17 1828 04/26/17 0558   04/25/17 1245  ceFAZolin (ANCEF) IVPB 2g/100 mL premix     2 g 200 mL/hr over 30 Minutes Intravenous On call to O.R. 04/25/17 1240 04/25/17 1505       Patient was given sequential compression devices and early ambulation to prevent DVT.   Patient benefited maximally from hospital stay and there were no complications. At the time of discharge, the patient was urinating/moving their bowels without  difficulty, tolerating a regular diet, pain is controlled with oral pain medications and they have been cleared by PT/OT.   Recent vital signs: No data found.    Recent laboratory studies: No results for input(s): WBC, HGB, HCT, PLT, NA, K, CL, CO2, BUN, CREATININE, GLUCOSE, INR, CALCIUM in the last 72 hours.  Invalid input(s): PT, 2   Discharge Medications:   Allergies as of 04/26/2017      Reactions   Other Rash   Pink Surgical tape (NG tube tape) causes redness rash on the skin.  Do not use  Hy tape       Medication List    STOP taking these medications   traMADol 50 MG tablet Commonly known as:  ULTRAM     TAKE these medications   ALPRAZolam 0.5 MG tablet Commonly known as:  XANAX Take 0.5 mg by mouth 2 (two) times daily as needed (for anxiety/sleep.).   ARIPiprazole 20 MG tablet Commonly known as:  ABILIFY Take 20 mg by mouth at bedtime.   atorvastatin 20 MG tablet Commonly known as:  LIPITOR Take 20 mg by mouth at bedtime.   carisoprodol 350 MG tablet Commonly known as:  SOMA Take 350 mg by mouth 3 (three) times daily as needed for muscle spasms.   FARXIGA 10 MG Tabs tablet Generic drug:  dapagliflozin propanediol Take 10 mg by mouth at bedtime.   gabapentin 100 MG capsule Commonly known as:  NEURONTIN Take 1 capsule (100 mg total) by mouth 2 (two) times daily.   glimepiride 2 MG tablet Commonly known as:  AMARYL Take 2 mg by mouth at bedtime.   metFORMIN 500 MG tablet  Commonly known as:  GLUCOPHAGE Take 1,000 mg by mouth 2 (two) times daily.   NASCOBAL 500 MCG/0.1ML Soln Generic drug:  Cyanocobalamin Place 500 mcg into the nose every /18/18 0737      Diagnostic Studies: Dg Cervical Spine 2 Or 3 Views  Result Date: 04/25/2017 CLINICAL DATA:  C5-7 ACDF with corpectomy EXAM: DG C-ARM 61-120 MIN; CERVICAL SPINE - 2-3 VIEW COMPARISON:  None. FINDINGS: Intraoperative fluoroscopic radiographs during C5-7 ACDF demonstrates surgical hardware in satisfactory position. IMPRESSION: Intraoperative fluoroscopic radiographs during C5-7 ACDF, as above. Electronically Signed   By: Charline Bills M.D.   On: 04/25/2017 18:15   Ct Cervical Spine Wo Contrast  Result Date: 04/06/2017 CLINICAL DATA:  Cervical stenosis, neck pain, pain down the right arm for 2 months EXAM: CT CERVICAL SPINE WITHOUT CONTRAST TECHNIQUE: Multidetector CT imaging of the cervical spine was performed without intravenous contrast. Multiplanar CT image reconstructions were also generated. COMPARISON:  04/02/2017 MR cervical spine FINDINGS: Alignment: Normal. Skull base and vertebrae: No acute fracture. No primary bone lesion or focal pathologic process. Soft tissues and spinal canal: No prevertebral fluid or swelling. No visible canal hematoma. Disc levels: Degenerative disc disease with disc height loss at C5-6 and C6-7. At C5-6 there is a broad-based disc osteophyte complex impressing on the thecal sac. Bilateral uncovertebral degenerative changes at C5-6 resulting in bilateral foraminal stenosis. Ossification of the posterior longitudinal ligament at C6-7 impressing on the thecal sac and narrowing the spinal canal. Right uncovertebral degenerative changes at C6-7 resulting in right foraminal stenosis. At C7-T1 there is a right paracentral/foraminal extrusion. Upper chest: Lung apices are clear. Other: No fluid collection or hematoma. IMPRESSION: 1. Degenerative disc disease with disc height loss at C5-6 and C6-7. Bilateral uncovertebral degenerative changes at C5-6 resulting in  bilateral foraminal stenosis. Ossification of the posterior longitudinal ligament at C6-7 impressing on the thecal sac and narrowing the spinal canal. Right uncovertebral degenerative changes at C6-7 resulting in right foraminal stenosis. 2. At C7-T1 there is a right paracentral/foraminal extrusion. Electronically Signed   By: Elige Ko   On: 04/06/2017 12:49   Dg C-arm 1-60 Min  Result Date: 04/25/2017 CLINICAL DATA:  C5-7 ACDF with corpectomy EXAM: DG C-ARM 61-120 MIN; CERVICAL SPINE - 2-3 VIEW COMPARISON:  None. FINDINGS: Intraoperative fluoroscopic radiographs during C5-7 ACDF demonstrates surgical hardware in satisfactory position. IMPRESSION: Intraoperative fluoroscopic radiographs during C5-7 ACDF, as above. Electronically Signed   By: Charline Bills M.D.   On: 04/25/2017 18:15   Xr Cervical Spine 2 Or 3 Views  Result Date: 05/03/2017 AP lateral C-spine x-rays demonstrate corpectomy with the long strut cage. Screws are adjacent to the disc at the the level above and below with good position of cage and screws. Impression: post corpectomy with cage and autograft C5-6, C6-7 with C6 corpectomy.  Xr Cervical Spine 2 Or 3 Views  Result Date: 04/05/2017 AP lateral cervical spine shows significant narrowing and anterior posterior spurring at C5-6 and C6-7.  There may be some calcification of posterior longitudinal ligament visualized. 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     Follow-up Information    Eldred Manges, MD Follow up in 1 week(s).   Specialty:  Orthopedic Surgery Contact information: [address] [place] [address] [phone]           Discharge Plan:  discharge to home  Disposition:     Signed: Zonia Kief  05/03/2017, 11:41 AM

## 2017-05-03 NOTE — Progress Notes (Signed)
   Post-Op Visit Note   Patient: Morgan Owens           Date of Birth: 05/04/1966           MRN: [id number] Visit Date: 05/03/2017 PCP: System, Pcp Not In   Assessment & Plan: Post C6 corpectomy and fusion from C5-C7. She neurologically intact no numbness or tingling in her fingers to separate the surgical result. Continue brace and recheck in 8 weeks for lateral flexion-extension x-ray out of collar. Work slip given no work 9 weeks. Recheck in 8 weeks. Prescription for Norco given 30 tablets 1 by mouth every 6 hours. She had a Worker's Comp. injury had some back problems and asked about an epidural injection on discussed with her I would recommend she waits until her neck is solidly healed since his is a multilevel decompression with corpectomy and if it does not successfully heal and she will require posterior surgery and the epidural steroid injection can delay some of the bone healing in the neck. Currently she set for next that she not do much of turning twisting lifting for lower back anyway so I think this can be delayed.  Chief Complaint:  Chief Complaint  Patient presents with  . Neck - Routine Post Op  . Right Wrist - Routine Post Op   Visit Diagnoses:  1. S/P cervical spinal fusion     Plan: Recheck 8 weeks with flexion-extension x-ray out of collar.  Follow-Up Instructions: No Follow-up on file.   Orders:  Orders Placed This Encounter  Procedures  . XR Cervical Spine 2 or 3 views   No orders of the defined types were placed in this encounter.   Imaging: AP lateral C-spine x-rays demonstrate corpectomy with the long strut cage. Screws are adjacent to the disc at the the level above and below with good position of cage and screws.  Impression: post corpectomy with cage and autograft C5-6, C6-7 with C6 corpectomy.   PMFS History: Patient Active Problem List   Diagnosis Date Noted  . Cervical spinal stenosis 04/25/2017  . Cervical stenosis of spine  04/05/2017   Past Medical History:  Diagnosis Date  . Anxiety   . Diabetes mellitus without complication (HCC)     Family History  Problem Relation Age of Onset  . Colon cancer Mother   . Heart disease Mother   . Diabetes Mother   . Diabetes Father   . Atrial fibrillation Father     Past Surgical History:  Procedure Laterality Date  . ANTERIOR CERVICAL DECOMP/DISCECTOMY FUSION N/A 04/25/2017   Procedure: C5-6, C6-7 Discectomy, C6 Corpectomy, Fusion C5-C7 Iliac Crest Allograft;  Surgeon: Eldred Manges, MD;  Location: [facility] OR;  Service: Orthopedics;  Laterality: N/A;  . BICEPS TENDON REPAIR    . CARPAL TUNNEL RELEASE Right 04/25/2017   Procedure: RIGHT CARPAL TUNNEL RELEASE;  Surgeon: Eldred Manges, MD;  Location: [facility] OR;  Service: Orthopedics;  Laterality: Right;  . CESAREAN SECTION    . CHOLECYSTECTOMY    . COLONOSCOPY    . GASTRIC BYPASS    . HERNIA REPAIR    . ROTATOR CUFF REPAIR     Social History   Occupational History  . Not on file.   Social History Main Topics  . Smoking status: Former Games developer  . Smokeless tobacco: Never Used  . Alcohol use No  . Drug use: No  . Sexual activity: Not on file

## 2017-05-17 ENCOUNTER — Telehealth (INDEPENDENT_AMBULATORY_CARE_PROVIDER_SITE_OTHER): Payer: Self-pay | Admitting: Orthopaedic Surgery

## 2017-05-17 NOTE — Telephone Encounter (Signed)
I left voicemail for patient to return my call.

## 2017-05-17 NOTE — Telephone Encounter (Signed)
Patient states she's had a sore throat since surgery 3 weeks ago and was wondering if she needed to see Dr Ophelia Charter.

## 2017-05-17 NOTE — Telephone Encounter (Signed)
I spoke with patient. She is having some hoarseness and her throat is sore. I advised that this is not uncommon after surgery and being put to sleep. She also states that she has had mucus drainage, possibly from sinuses and not feeling well. I advised that she should call her PCP to see if they can get her in to check for possible illness. She will call me back if PCP feels this is related to neck and she needs appt. I did advise we would work her in to the schedule if needed.

## 2017-05-26 ENCOUNTER — Encounter (INDEPENDENT_AMBULATORY_CARE_PROVIDER_SITE_OTHER): Payer: Self-pay | Admitting: Orthopaedic Surgery

## 2017-05-26 ENCOUNTER — Ambulatory Visit (INDEPENDENT_AMBULATORY_CARE_PROVIDER_SITE_OTHER): Payer: BLUE CROSS/BLUE SHIELD | Admitting: Orthopaedic Surgery

## 2017-05-26 VITALS — BP 105/71 | HR 106

## 2017-05-26 DIAGNOSIS — Z981 Arthrodesis status: Secondary | ICD-10-CM

## 2017-05-26 MED ORDER — TRAMADOL HCL 50 MG PO TABS: 50.0000 mg | ORAL_TABLET | Freq: Two times a day (BID) | ORAL | 0 refills | Status: DC | PRN

## 2017-05-26 NOTE — Progress Notes (Signed)
   Post-Op Visit Note   Patient: Morgan Owens           Date of Birth: 08/13/1965           MRN: [id number] Visit Date: 05/26/2017 PCP: System, Pcp Not In   Assessment & Plan: Postop C6 corpectomy for severe spinal stenosis with long strut cage and fusion C5-C7. She also had right carpal tunnel release. Return in 4 weeks repeat x-ray C-spine with swimmer's view for visualization of the bottom of the fusion site and AP x-ray on return. Continue collar. Work slip given for no work 6 weeks. She got good relief of her preop arm symptoms she has little bit of trouble at night sleeping prescription given for tramadol. X-rays on return as above  Chief Complaint:  Chief Complaint  Patient presents with  . Neck - Follow-up  . Right Wrist - Follow-up   Visit Diagnoses:  1. S/P cervical spinal fusion     Plan: Return in 4 weeks works of given no work 6 weeks  Follow-Up Instructions: Return in about 4 weeks (around 06/23/2017).   Orders:  No orders of the defined types were placed in this encounter.  No orders of the defined types were placed in this encounter.   Imaging: No results found.  PMFS History: Patient Active Problem List   Diagnosis Date Noted  . Cervical spinal stenosis 04/25/2017   Past Medical History:  Diagnosis Date  . Anxiety   . Diabetes mellitus without complication (HCC)     Family History  Problem Relation Age of Onset  . Colon cancer Mother   . Heart disease Mother   . Diabetes Mother   . Diabetes Father   . Atrial fibrillation Father     Past Surgical History:  Procedure Laterality Date  . ANTERIOR CERVICAL DECOMP/DISCECTOMY FUSION N/A 04/25/2017   Procedure: C5-6, C6-7 Discectomy, C6 Corpectomy, Fusion C5-C7 Iliac Crest Allograft;  Surgeon: Eldred MangesYates, Ayush Boulet C, MD;  Location: [facility] OR;  Service: Orthopedics;  Laterality: N/A;  . BICEPS TENDON REPAIR    . CARPAL TUNNEL RELEASE Right 04/25/2017   Procedure: RIGHT CARPAL TUNNEL RELEASE;  Surgeon:  Eldred MangesYates, Finn Amos C, MD;  [facility];  Service: Orthopedics;  Laterality: Right;  . CESAREAN SECTION    . CHOLECYSTECTOMY    . COLONOSCOPY    . GASTRIC BYPASS    . HERNIA REPAIR    . ROTATOR CUFF REPAIR     Social History   Occupational History  . Not on file.   Social History Main Topics  . Smoking status: Former Games developermoker  . Smokeless tobacco: Never Used  . Alcohol use No  . Drug use: No  . Sexual activity: Not on file

## 2017-06-23 ENCOUNTER — Encounter (INDEPENDENT_AMBULATORY_CARE_PROVIDER_SITE_OTHER): Payer: Self-pay | Admitting: Orthopaedic Surgery

## 2017-06-23 ENCOUNTER — Ambulatory Visit (INDEPENDENT_AMBULATORY_CARE_PROVIDER_SITE_OTHER): Payer: BLUE CROSS/BLUE SHIELD | Admitting: Orthopaedic Surgery

## 2017-06-23 ENCOUNTER — Ambulatory Visit (INDEPENDENT_AMBULATORY_CARE_PROVIDER_SITE_OTHER): Payer: BLUE CROSS/BLUE SHIELD

## 2017-06-23 DIAGNOSIS — Z981 Arthrodesis status: Secondary | ICD-10-CM

## 2017-06-23 NOTE — Progress Notes (Signed)
   Post-Op Visit Note   Patient: Morgan Owens           Date of Birth: 02/10/1966           MRN: [id number] Visit Date: 06/23/2017 PCP: System, Pcp Not In   Assessment & Plan: Post corpectomy fusion C5 C7 with plate and allograft.  Chief Complaint:  Chief Complaint  Patient presents with  . Right Wrist - Routine Post Op  . Neck - Routine Post Op   Visit Diagnoses:  1. Status post cervical spinal fusion     Plan: Recheck 5 weeks.  We will obtain flexion-extension x-rays out of her collar on return.  Carpal tunnel incision is healed nicely.  She still has some weakness in her grip which should improve with time.  Follow-Up Instructions: Return in about 5 weeks (around 07/28/2017).   Orders:  Orders Placed This Encounter  Procedures  . XR Cervical Spine 2 or 3 views   No orders of the defined types were placed in this encounter.   Imaging: Xr Cervical Spine 2 Or 3 Views  Result Date: 06/23/2017 AP lateral and cervical swimmer's view obtained and reviewed.  This shows fusion from C5-C7 with long allograft.  No motion is noted posteriorly.  She has some healing in the posterior aspect of the graft to the adjacent vertebrae.  Plate and screws are in good position. Impression: Satisfactory corpectomy C5 C7 fusion good position of hardware and graft with early healing.   PMFS History: Patient Active Problem List   Diagnosis Date Noted  . Cervical spinal stenosis 04/25/2017   Past Medical History:  Diagnosis Date  . Anxiety   . Diabetes mellitus without complication (HCC)     Family History  Problem Relation Age of Onset  . Colon cancer Mother   . Heart disease Mother   . Diabetes Mother   . Diabetes Father   . Atrial fibrillation Father     Past Surgical History:  Procedure Laterality Date  . ANTERIOR CERVICAL DECOMP/DISCECTOMY FUSION N/A 04/25/2017   Procedure: C5-6, C6-7 Discectomy, C6 Corpectomy, Fusion C5-C7 Iliac Crest Allograft;  Surgeon: Eldred MangesYates, Shiro Ellerman  C, MD;  Location: [facility] OR;  Service: Orthopedics;  Laterality: N/A;  . BICEPS TENDON REPAIR    . CARPAL TUNNEL RELEASE Right 04/25/2017   Procedure: RIGHT CARPAL TUNNEL RELEASE;  Surgeon: Eldred MangesYates, Shanecia Hoganson C, MD;  [facility];  Service: Orthopedics;  Laterality: Right;  . CESAREAN SECTION    . CHOLECYSTECTOMY    . COLONOSCOPY    . GASTRIC BYPASS    . HERNIA REPAIR    . ROTATOR CUFF REPAIR     Social History   Occupational History  . Not on file  Tobacco Use  . Smoking status: Former Games developermoker  . Smokeless tobacco: Never Used  Substance and Sexual Activity  . Alcohol use: No  . Drug use: No  . Sexual activity: Not on file

## 2017-06-27 ENCOUNTER — Encounter (INDEPENDENT_AMBULATORY_CARE_PROVIDER_SITE_OTHER): Payer: Self-pay | Admitting: Orthopaedic Surgery

## 2017-07-28 ENCOUNTER — Ambulatory Visit (INDEPENDENT_AMBULATORY_CARE_PROVIDER_SITE_OTHER): Payer: BLUE CROSS/BLUE SHIELD | Admitting: Orthopaedic Surgery

## 2017-07-28 ENCOUNTER — Ambulatory Visit (INDEPENDENT_AMBULATORY_CARE_PROVIDER_SITE_OTHER): Payer: Self-pay

## 2017-07-28 ENCOUNTER — Encounter (INDEPENDENT_AMBULATORY_CARE_PROVIDER_SITE_OTHER): Payer: Self-pay | Admitting: Orthopaedic Surgery

## 2017-07-28 VITALS — BP 104/74 | HR 96 | Ht 68.0 in | Wt 218.0 lb

## 2017-07-28 DIAGNOSIS — Z981 Arthrodesis status: Secondary | ICD-10-CM

## 2017-07-28 NOTE — Progress Notes (Signed)
Office Visit Note   Patient: Morgan Owens           Date of Birth: 07/24/1966           MRN: [id number] Visit Date: 07/28/2017              Requested by: No referring provider defined for this encounter. PCP: System, Pcp Not In   Assessment & Plan: Visit Diagnoses:  1. Status post cervical spinal fusion     Plan: Slip given for work resumption on 08/10/2017.  I plan to recheck her in 3 months.  With repeat x-rays lateral flexion-extension cervical spine without swimmer's view return  Follow-Up Instructions: No Follow-up on file.   Orders:  Orders Placed This Encounter  Procedures  . XR Cervical Spine 2 or 3 views   No orders of the defined types were placed in this encounter.     Procedures: No procedures performed   Clinical Data: No additional findings.   Subjective: Chief Complaint  Patient presents with  . Neck - Routine Post Op  . Right Wrist - Routine Post Op    HPI 51 year old female returns post corpectomy for stenosis with fusion from C5-C7.  Her hand feels good strength is improving.  Gait disturbance.  Review of Systems review of systems updated unchanged from last office visit and unchanged from surgery in September 2018 other than as mentioned in HPI.   Objective: Vital Signs: BP 104/74   Pulse 96   Ht 5\' 8"  (1.727 m)   Wt 218 lb (98.9 kg)   BMI 33.15 kg/m   Physical Exam  Constitutional: She is oriented to person, place, and time. She appears well-developed.  HENT:  Head: Normocephalic.  Right Ear: External ear normal.  Left Ear: External ear normal.  Eyes: Pupils are equal, round, and reactive to light.  Neck: No tracheal deviation present. No thyromegaly present.  Cardiovascular: Normal rate.  Pulmonary/Chest: Effort normal.  Abdominal: Soft.  Neurological: She is alert and oriented to person, place, and time.  Skin: Skin is warm and dry.  Psychiatric: She has a normal mood and affect. Her behavior is normal.    Ortho  Exam is well-healed she is been in her Aspen collar.  Reflexes are 1+ and symmetrical.  She has good grip strength good biceps triceps.  Specialty Comments:  No specialty comments available.  Imaging: No results found.   PMFS History: Patient Active Problem List   Diagnosis Date Noted  . Status post cervical spinal fusion 06/23/2017  . Cervical spinal stenosis 04/25/2017   Past Medical History:  Diagnosis Date  . Anxiety   . Diabetes mellitus without complication (HCC)     Family History  Problem Relation Age of Onset  . Colon cancer Mother   . Heart disease Mother   . Diabetes Mother   . Diabetes Father   . Atrial fibrillation Father     Past Surgical History:  Procedure Laterality Date  . ANTERIOR CERVICAL DECOMP/DISCECTOMY FUSION N/A 04/25/2017   Procedure: C5-6, C6-7 Discectomy, C6 Corpectomy, Fusion C5-C7 Iliac Crest Allograft;  Surgeon: Morgan MangesYates, Yarixa Lightcap C, MD;  Location: [facility] OR;  Service: Orthopedics;  Laterality: N/A;  . BICEPS TENDON REPAIR    . CARPAL TUNNEL RELEASE Right 04/25/2017   Procedure: RIGHT CARPAL TUNNEL RELEASE;  Surgeon: Morgan MangesYates, Huberta Tompkins C, MD;  [facility];  Service: Orthopedics;  Laterality: Right;  . CESAREAN SECTION    . CHOLECYSTECTOMY    . COLONOSCOPY    . GASTRIC  BYPASS    . HERNIA REPAIR    . ROTATOR CUFF REPAIR     Social History   Occupational History  . Not on file  Tobacco Use  . Smoking status: Former Games developermoker  . Smokeless tobacco: Never Used  Substance and Sexual Activity  . Alcohol use: No  . Drug use: No  . Sexual activity: Not on file

## 2017-08-16 ENCOUNTER — Telehealth (INDEPENDENT_AMBULATORY_CARE_PROVIDER_SITE_OTHER): Payer: Self-pay | Admitting: Orthopaedic Surgery

## 2017-08-16 NOTE — Telephone Encounter (Signed)
Patient would like for you to call her today, she has a question about a test she had done.  CB#[phone].  Thank you.

## 2017-08-16 NOTE — Telephone Encounter (Signed)
I left voicemail for patient. Will try and return call to her this afternoon.

## 2017-08-17 NOTE — Telephone Encounter (Signed)
I left voicemail for patient. Asked for her to return call when she is able.

## 2017-08-17 NOTE — Telephone Encounter (Signed)
Patient returned call. She was questioning what the test was that she had previously. I informed it was the EMG/NCV. She is having some problems with her back that is a WC  Issue. She was wondering if they do these tests for your lower back. I advised they do sometimes if it is warranted and the physician that she is being treated by would make that decision. She is concerned they are doing nothing for her. I advised her best plan is to discuss all of her problems with her nurse case manager.

## 2017-09-28 ENCOUNTER — Telehealth (INDEPENDENT_AMBULATORY_CARE_PROVIDER_SITE_OTHER): Payer: Self-pay | Admitting: Orthopaedic Surgery

## 2017-09-28 NOTE — Telephone Encounter (Signed)
Patient called stating records not received at [facility]. I called her back and LMAM that records faxed 09/01/2017 and that I would refax

## 2017-11-03 ENCOUNTER — Inpatient Hospital Stay (INDEPENDENT_AMBULATORY_CARE_PROVIDER_SITE_OTHER): Payer: BLUE CROSS/BLUE SHIELD | Admitting: Orthopaedic Surgery

## 2018-03-21 ENCOUNTER — Telehealth (INDEPENDENT_AMBULATORY_CARE_PROVIDER_SITE_OTHER): Payer: Self-pay | Admitting: Radiology

## 2018-03-21 NOTE — Telephone Encounter (Signed)
Please see below message. We had received message from paralegal previously and they asked for 15 minute phone conference with you regarding work place injury. There is no mention injury in our notes. At your request, we explained that we could send office notes if patient had signed authorization on file. The following email was received by the attorney's office.   Please advise on what you would like to do regarding scheduling phone call. Thanks.

## 2018-03-21 NOTE — Telephone Encounter (Signed)
From: Inis Sizerachel Berry [email or web address]> Sent: Tuesday, March 14, 2018 8:44 AM To: Tora DuckGault, Margaret [email or web address]> Subject: [External Email]RE: secure  *Caution - External email - see footer for warnings*   I did not receive that email Friday nor do I see it in my junk box.  Nevertheless, we are not in need of her office notes. In fact, we've already received them from our request months ago.  I'm looking to schedule a phone conference with Dr. Ophelia CharterYates for Mrs. Collyer's attorney, Kelly SplinterMichael Beste.    Inis Sizerachel Berry  Paralegal [address], [address] [place], [place] [id number] [phone] ([address]) [phone] (Toll Free) [phone] (Direct Dial) [phone] (FaxInbox) [email or web address]

## 2018-03-22 NOTE — Telephone Encounter (Signed)
I called done. Talked times 15 minutes. Tammie can bill. thanks

## 2018-03-23 NOTE — Telephone Encounter (Signed)
Payment letter emailed to Inis Sizerachel Berry

## 2018-06-27 ENCOUNTER — Telehealth (INDEPENDENT_AMBULATORY_CARE_PROVIDER_SITE_OTHER): Payer: Self-pay | Admitting: Orthopaedic Surgery

## 2018-06-27 NOTE — Telephone Encounter (Signed)
I received a vm from [place] @ [employer] checking status of request. I called her back [phone],and lmvm,advised that records were faxed 11/13 -44 pages with confirmation. I will refax this morning

## 2019-07-22 IMAGING — MR MR CERVICAL SPINE W/O CM
5 series · 28 of 48 positions shown · non-contrast
Comparison: None.

CLINICAL DATA: Neck pain and RIGHT arm pain. C7 radiculopathy.
Symptoms for 2 months. Weakness in RIGHT hand.

EXAM:
MRI CERVICAL SPINE WITHOUT CONTRAST
TECHNIQUE: Multiplanar, multisequence MR imaging of the cervical spine was
performed. No intravenous contrast was administered.

[Series 3: T2 · sagittal · 3.0mm · 0.66mm/px · 6 of 15 slices shown (1 of 2)]
[im 1/15]
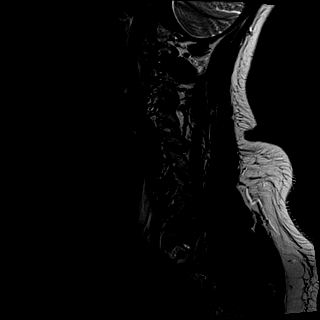
[im 3/15]
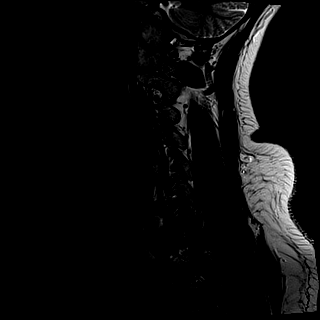
[im 6/15]
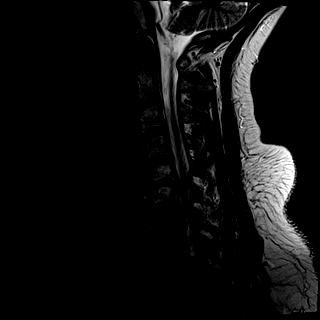
[im 9/15]
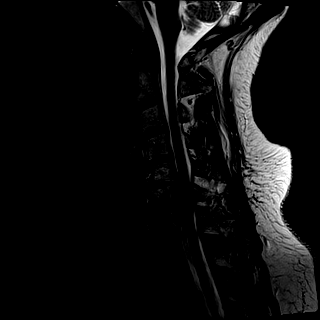
[im 12/15]
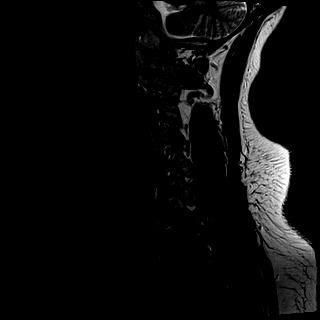
[im 15/15]
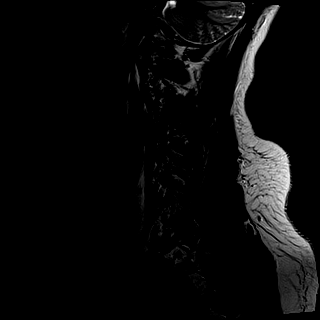

[Series 4: T1 · sagittal · 3.0mm · 0.41mm/px · 6 of 15 slices shown]
[im 1/15]
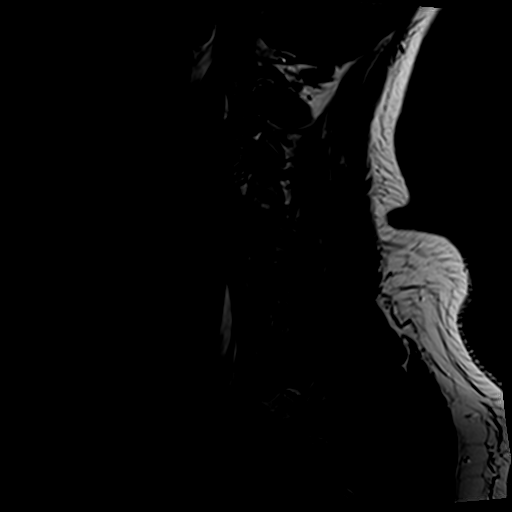
[im 3/15]
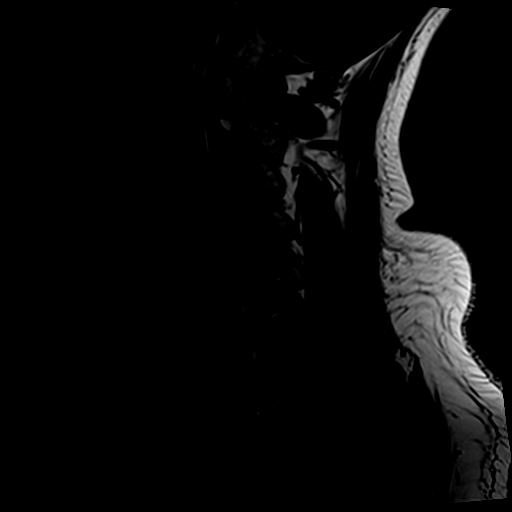
[im 6/15]
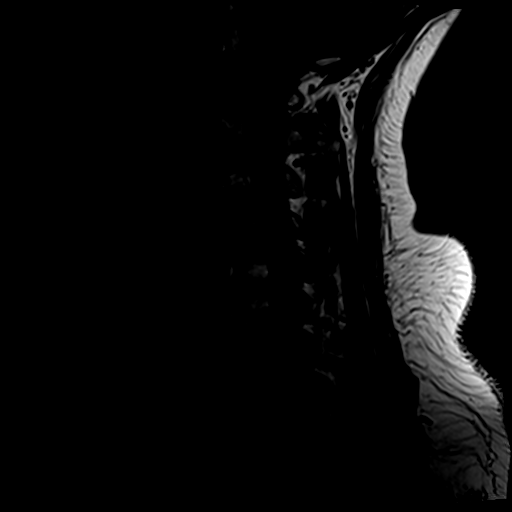
[im 9/15]
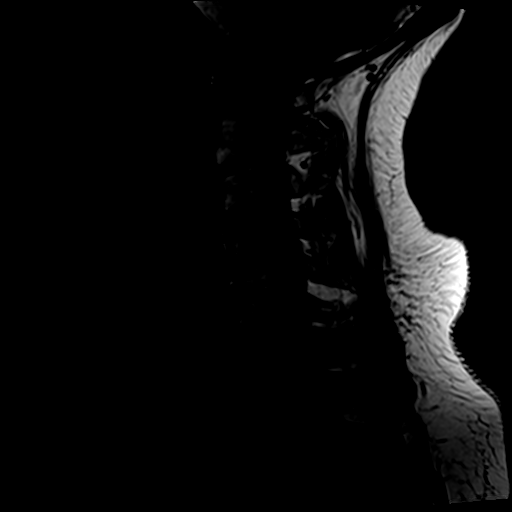
[im 12/15]
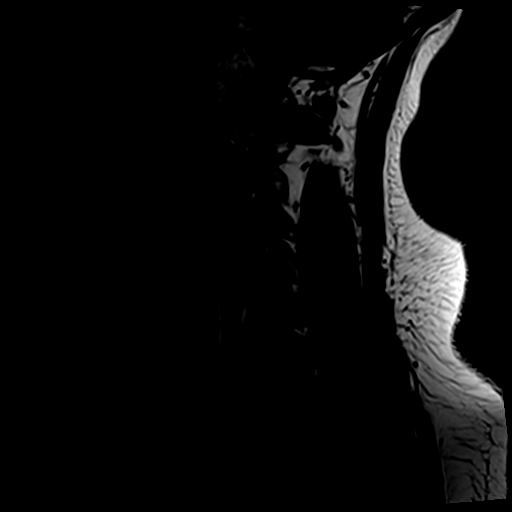
[im 15/15]
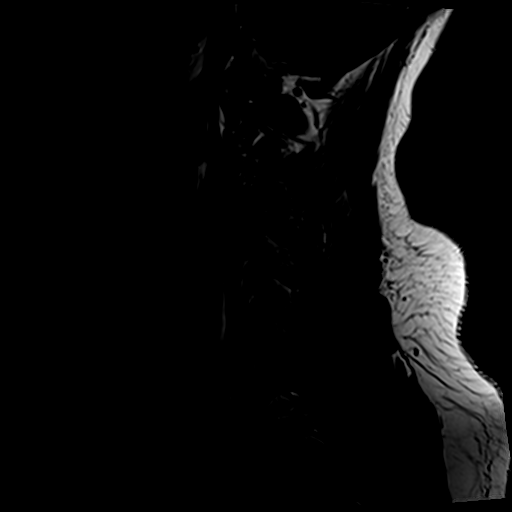

[Series 5: tir sag · sagittal · 3.0mm · 0.41mm/px · 6 of 15 slices shown]
[im 1/15]
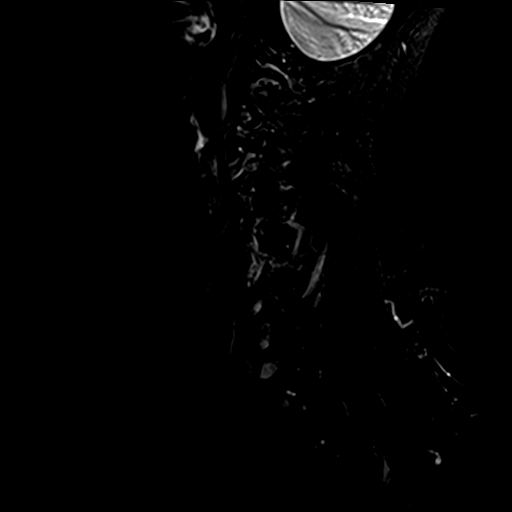
[im 3/15]
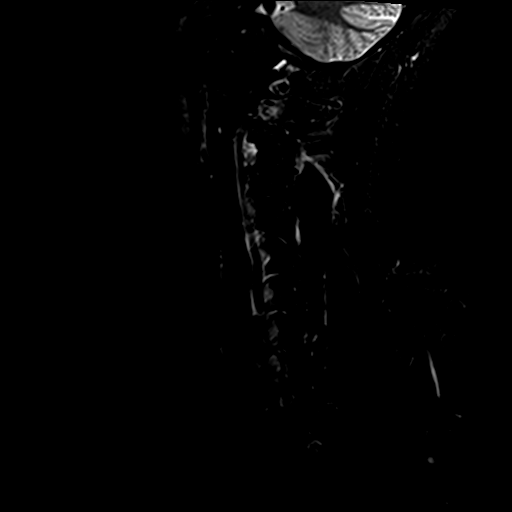
[im 6/15]
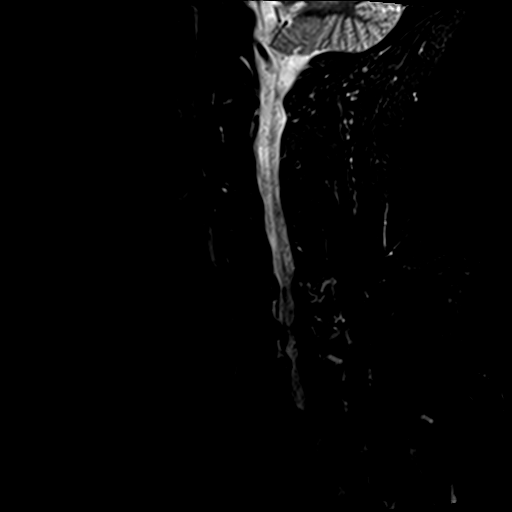
[im 9/15]
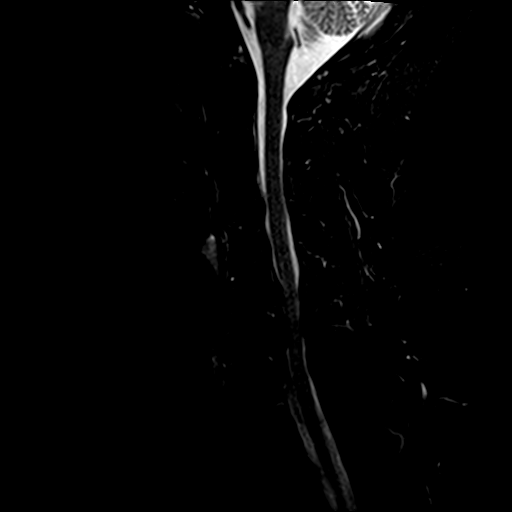
[im 12/15]
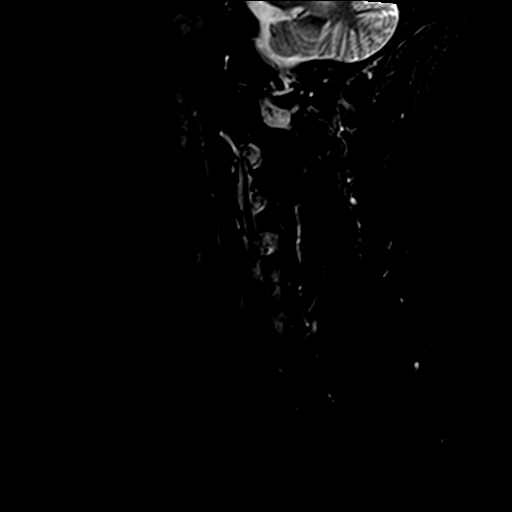
[im 15/15]
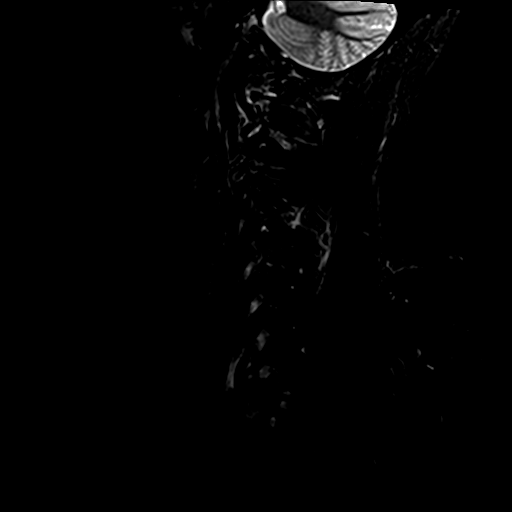

[Series 6: GRE · axial · 3.0mm · 0.35mm/px · 1 of 40 slices shown]
[im 1/40]
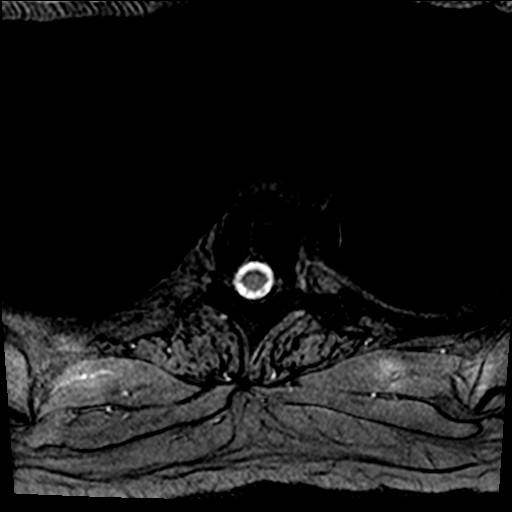

[Series 7: T2 · axial · 3.0mm · 0.70mm/px · z∈[-109,+37]mm · 9 of 40 slices shown (2 of 2)]
[im 1/40]
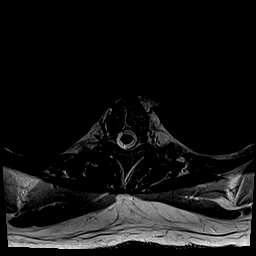
[im 6/40]
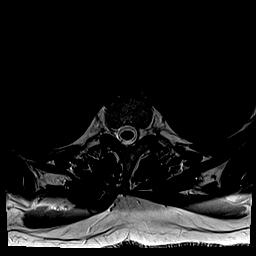
[im 12/40]
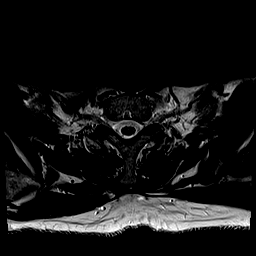
[im 17/40]
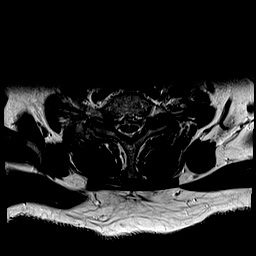
[im 20/40]
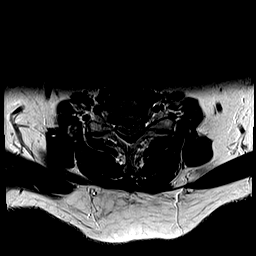
[im 23/40]
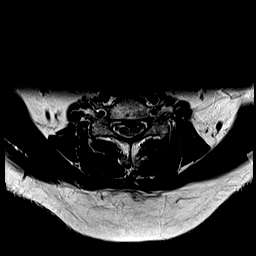
[im 28/40]
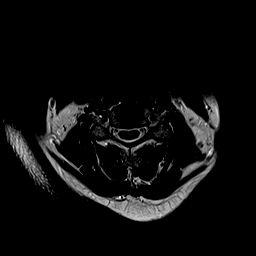
[im 34/40]
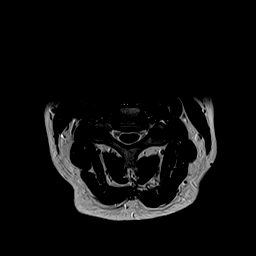
[im 40/40]
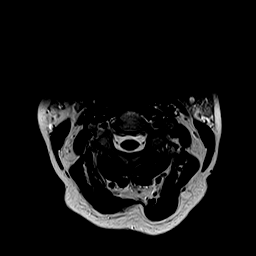

[28 of 48 positions shown; findings below may reference images not displayed]

FINDINGS: Alignment: Straightening of the normal cervical lordosis. There may
be slight retrolisthesis at C5-6 and C6-7.

Vertebrae: Endplate reactive changes most prominent above and below
C5-6. No worrisome osseous lesion.

Cord: Cord flattening with suspected abnormal cord signal at C6-7 on
the RIGHT, possibly at C5-6.

Posterior Fossa, vertebral arteries, paraspinal tissues: No
tonsillar herniation. Flow voids are maintained. No neck masses.

Disc levels:

C2-3:  Unremarkable.

C3-4: Central protrusion. Effacement anterior subarachnoid space
with minimal cord flattening. No C4 foraminal narrowing.

C4-5:  Normal.

C5-6: Severe disc space narrowing. Central protrusion/extrusion
appears focally pronounced, with marked T2 shortening suggestive of
either calcified disc or ossification of posterior longitudinal
ligament. BILATERAL C6 foraminal narrowing. Significant cord
flattening. Canal diameter 5-6 mm. Possible abnormal cord signal.

C6-7: Marked disc space narrowing. Central protrusion/ extrusion.
Marked T2 shortening suggestive of calcified disc or ossification
posterior longitudinal ligament. Marked cord flattening. Abnormal
cord signal suspected on the RIGHT. BILATERAL C7 foraminal
narrowing.

C7-T1: Central and rightward extrusion. RIGHT C8 foraminal
narrowing. No significant stenosis at this level.
IMPRESSION: The dominant RIGHT-sided abnormality is at C7-T1 where a central and
rightward extrusion is observed. There is significant RIGHT C8
foraminal narrowing.

Severe two level spinal stenosis at C5-6 and C6-7, with either
calcified central protrusion/ extrusions or ossification of the
posterior longitudinal ligament. There is cord flattening with
suspected abnormal cord signal, as well as BILATERAL C6 and C7
foraminal narrowing.

Consider CT of the cervical spine without contrast to evaluate for
possible OPLL from C5 through C7. This could also be done in the
setting of cervical myelography as clinically indicated.

## 2020-02-25 ENCOUNTER — Telehealth: Payer: Self-pay

## 2020-02-25 NOTE — Telephone Encounter (Signed)
Patient LMVM on triage phone asking for appt with Dr Ophelia Charter to be treated for ankle injury. Called her back to schedule. No answer. LMVM for her to call our office back to get appt scheduled.

## 2020-02-26 ENCOUNTER — Encounter: Payer: Self-pay | Admitting: Orthopaedic Surgery

## 2020-02-26 ENCOUNTER — Ambulatory Visit: Payer: Medicare Other | Admitting: Orthopaedic Surgery

## 2020-02-26 ENCOUNTER — Ambulatory Visit: Payer: Self-pay

## 2020-02-26 VITALS — BP 114/76 | HR 93 | Ht 68.0 in | Wt 202.0 lb

## 2020-02-26 DIAGNOSIS — M25572 Pain in left ankle and joints of left foot: Secondary | ICD-10-CM | POA: Diagnosis not present

## 2020-02-26 DIAGNOSIS — S8263XA Displaced fracture of lateral malleolus of unspecified fibula, initial encounter for closed fracture: Secondary | ICD-10-CM | POA: Insufficient documentation

## 2020-02-26 DIAGNOSIS — S8265XA Nondisplaced fracture of lateral malleolus of left fibula, initial encounter for closed fracture: Secondary | ICD-10-CM

## 2020-02-26 MED ORDER — HYDROCODONE-ACETAMINOPHEN 5-325 MG PO TABS: 1.0000 | ORAL_TABLET | Freq: Four times a day (QID) | ORAL | 0 refills | Status: DC | PRN

## 2020-02-26 NOTE — Progress Notes (Signed)
Office Visit Note   Patient: Morgan Owens           Date of Birth: 04-30-1966           MRN: [id number] Visit Date: 02/26/2020              Requested by: No referring provider defined for this encounter. PCP: System, Pcp Not In   Assessment & Plan: Visit Diagnoses:  1. Pain in left ankle and joints of left foot   2. Closed nondisplaced fracture of lateral malleolus of left fibula, initial encounter     Plan: Patient wrapped in Ace wrap Cam boot applied.  She needs to get her foot elevated.  Norco sent in for pain number 30 tablets 1 p.o. every 6 as needed.  Nonweightbearing.  Recheck 6 weeks repeat x-rays of her left ankle on return.  She understands she should not drive or operate machinery while she is taking the pain medication.  Follow-Up Instructions: Return in about 6 weeks (around 04/08/2020).   Orders:  Orders Placed This Encounter  Procedures   XR Ankle Complete Left   Meds ordered this encounter  Medications   HYDROcodone-acetaminophen (NORCO/VICODIN) 5-325 MG tablet    Sig: Take 1 tablet by mouth every 6 (six) hours as needed for moderate pain.    Dispense:  30 tablet    Refill:  0      Procedures: No procedures performed   Clinical Data: No additional findings.   Subjective: Chief Complaint  Patient presents with   Left Ankle - Fracture    DOI 02/22/2020    HPI 54 year old female who had previous two-level cervical fusion with corpectomy for spinal stenosis by me in 2018 is seen with a new injury with left ankle injury.  She was in [place] visiting her mother stood up from a lawn chair in the yard and rolled her ankle with a loud pop and inability to ambulate.  She states she spent 18 hours in a [place] emergency room originally then went to an urgent care was told she had a fracture of the lateral malleolus.  She is placed in a posterior splint has crutches and has been nonweightbearing.  She has had considerable increased pain ecchymosis  states she did not have any pain medication and drove back from [place] with her right foot.  No other injuries occurred.  Review of Systems previous lumbar surgery and [place] 2019.  Two-level cervical discectomy C6 corpectomy for severe cervical stenosis with myelopathy.  Otherwise noncontributory to HPI all other systems reviewed.   Objective: Vital Signs: BP 114/76    Pulse 93    Ht 5\' 8"  (1.727 m)    Wt 202 lb (91.6 kg)    BMI 30.71 kg/m   Physical Exam Constitutional:      Appearance: She is well-developed.  HENT:     Head: Normocephalic.     Right Ear: External ear normal.     Left Ear: External ear normal.  Eyes:     Pupils: Pupils are equal, round, and reactive to light.  Neck:     Thyroid: No thyromegaly.     Trachea: No tracheal deviation.  Cardiovascular:     Rate and Rhythm: Normal rate.  Pulmonary:     Effort: Pulmonary effort is normal.  Abdominal:     Palpations: Abdomen is soft.  Skin:    General: Skin is warm and dry.  Neurological:     Mental Status: She is alert and oriented  to person, place, and time.  Psychiatric:        Behavior: Behavior normal.     Ortho Exam patient has lateral foot ecchymosis significant swelling extreme tenderness palpation of the lateral malleolus.  Specialty Comments:  No specialty comments available.  Imaging: XR Ankle Complete Left  Result Date: 02/26/2020 Three-view x-rays left ankle obtained and reviewed.  This shows small amount of spurring medial lateral consistent with previous ankle sprains.  There is a transverse fracture of the distal aspect of the lateral malleolus without significant displacement. Impression: Nondisplaced lateral malleolar fracture,acute.    PMFS History: Patient Active Problem List   Diagnosis Date Noted   Lateral malleolar fracture 02/26/2020   Status post cervical spinal fusion 06/23/2017   Cervical spinal stenosis 04/25/2017   Past Medical History:  Diagnosis Date    Anxiety    Diabetes mellitus without complication (HCC)     Family History  Problem Relation Age of Onset   Colon cancer Mother    Heart disease Mother    Diabetes Mother    Diabetes Father    Atrial fibrillation Father     Past Surgical History:  Procedure Laterality Date   ANTERIOR CERVICAL DECOMP/DISCECTOMY FUSION N/A 04/25/2017   Procedure: C5-6, C6-7 Discectomy, C6 Corpectomy, Fusion C5-C7 Iliac Crest Allograft;  Surgeon: Eldred Manges, MD;  Location: [facility] OR;  Service: Orthopedics;  Laterality: N/A;   BICEPS TENDON REPAIR     CARPAL TUNNEL RELEASE Right 04/25/2017   Procedure: RIGHT CARPAL TUNNEL RELEASE;  Surgeon: Eldred Manges, MD;  Location: [facility] OR;  Service: Orthopedics;  Laterality: Right;   CESAREAN SECTION     CHOLECYSTECTOMY     COLONOSCOPY     GASTRIC BYPASS     HERNIA REPAIR     ROTATOR CUFF REPAIR     Social History   Occupational History   Not on file  Tobacco Use   Smoking status: Former Smoker   Smokeless tobacco: Never Used  Building services engineer Use: Every day  Substance and Sexual Activity   Alcohol use: No   Drug use: No   Sexual activity: Not on file

## 2020-03-03 ENCOUNTER — Telehealth: Payer: Self-pay

## 2020-03-03 NOTE — Telephone Encounter (Signed)
Patient would like to know if it is normal for her to still be having a lot of pain with her left ankle?  Stated that she has been in the boot and elevating.  CB# [phone].  Please advise.  Thank you.

## 2020-03-03 NOTE — Telephone Encounter (Signed)
I called patient and discussed elevating ankle higher than heart. She will try this for pain relief. She is to call if continued problems/questions.

## 2020-04-08 ENCOUNTER — Encounter: Payer: Self-pay | Admitting: Orthopaedic Surgery

## 2020-04-08 ENCOUNTER — Other Ambulatory Visit: Payer: Self-pay

## 2020-04-08 ENCOUNTER — Ambulatory Visit (INDEPENDENT_AMBULATORY_CARE_PROVIDER_SITE_OTHER): Payer: Medicare Other | Admitting: Orthopaedic Surgery

## 2020-04-08 ENCOUNTER — Ambulatory Visit: Payer: Self-pay

## 2020-04-08 VITALS — BP 121/73 | HR 93 | Ht 68.0 in | Wt 208.0 lb

## 2020-04-08 DIAGNOSIS — M25572 Pain in left ankle and joints of left foot: Secondary | ICD-10-CM | POA: Diagnosis not present

## 2020-04-08 NOTE — Progress Notes (Signed)
Office Visit Note   Patient: Morgan Owens           Date of Birth: 1965-11-30           MRN: [id number] Visit Date: 04/08/2020              Requested by: No referring provider defined for this encounter. PCP: System, Pcp Not In   Assessment & Plan: Visit Diagnoses:  1. Pain in left ankle and joints of left foot     Plan: Patient can wean herself out of the cam boot.  Resume normal activities.  If her intermittent numbness in her hand gets worse she will let us know.  We reviewed previous C-spine x-rays that showed satisfactory incorporation from her previous cervical fusion.  Follow-Up Instructions: Return if symptoms worsen or fail to improve.   Orders:  Orders Placed This Encounter  Procedures  . XR Ankle Complete Left   No orders of the defined types were placed in this encounter.     Procedures: No procedures performed   Clinical Data: No additional findings.   Subjective: Chief Complaint  Patient presents with  . Left Ankle - Fracture, Follow-up    DOI 02/22/2020    HPI 6 weeks follow-up left lateral malleolar fracture Weber a distal transverse with previous injuries to her ankle in the past with spurring and small calcified areas in the deltoid ligament.  She states she has had just mild discomfort she is using her crutches any longer is walking with the cam boot on.  She removes it for bathing and has not noted any lateral pain.  Has had some numbness and tingling in the right hand which had previous carpal tunnel release and she also had corpectomy and fusion from C5-C7 for severe stenosis.  She denies any problems with her neck.  No gait disturbance.  Review of Systems updated unchanged from last visit all other systems..   Objective: Vital Signs: BP 121/73   Pulse 93   Ht 5\' 8"  (1.727 m)   Wt 208 lb (94.3 kg)   BMI 31.63 kg/m   Physical Exam Constitutional:      Appearance: She is well-developed.  HENT:     Head: Normocephalic.      Right Ear: External ear normal.     Left Ear: External ear normal.  Eyes:     Pupils: Pupils are equal, round, and reactive to light.  Neck:     Thyroid: No thyromegaly.     Trachea: No tracheal deviation.  Cardiovascular:     Rate and Rhythm: Normal rate.  Pulmonary:     Effort: Pulmonary effort is normal.  Abdominal:     Palpations: Abdomen is soft.  Skin:    General: Skin is warm and dry.  Neurological:     Mental Status: She is alert and oriented to person, place, and time.  Psychiatric:        Behavior: Behavior normal.     Ortho Exam patient has ankle range of motion without tenderness.  Trace ankle swelling and slight tenderness distal fibula.  Pulses normal sensation is intact. Specialty Comments:  No specialty comments available.  Imaging: XR Ankle Complete Left  Result Date: 04/08/2020 Three-view x-rays left ankle AP lateral and mortise view were obtained and reviewed.  This shows interval healing of the lateral malleolar distal transverse fracture Weber a.  Old deltoid ligament calcification small chronic areas are visualized consistent with previous injury.  Ankle mortise is well reduced. Impression:  Interval healing lateral malleolus fracture.  Spurring from previous ankle injuries.    PMFS History: Patient Active Problem List   Diagnosis Date Noted  . Lateral malleolar fracture 02/26/2020  . Status post cervical spinal fusion 06/23/2017  . Cervical spinal stenosis 04/25/2017   Past Medical History:  Diagnosis Date  . Anxiety   . Diabetes mellitus without complication (HCC)     Family History  Problem Relation Age of Onset  . Colon cancer Mother   . Heart disease Mother   . Diabetes Mother   . Diabetes Father   . Atrial fibrillation Father     Past Surgical History:  Procedure Laterality Date  . ANTERIOR CERVICAL DECOMP/DISCECTOMY FUSION N/A 04/25/2017   Procedure: C5-6, C6-7 Discectomy, C6 Corpectomy, Fusion C5-C7 Iliac Crest Allograft;  Surgeon:  Eldred Manges, MD;  Location: [facility] OR;  Service: Orthopedics;  Laterality: N/A;  . BICEPS TENDON REPAIR    . CARPAL TUNNEL RELEASE Right 04/25/2017   Procedure: RIGHT CARPAL TUNNEL RELEASE;  Surgeon: Eldred Manges, MD;  Location: [facility] OR;  Service: Orthopedics;  Laterality: Right;  . CESAREAN SECTION    . CHOLECYSTECTOMY    . COLONOSCOPY    . GASTRIC BYPASS    . HERNIA REPAIR    . ROTATOR CUFF REPAIR     Social History   Occupational History  . Not on file  Tobacco Use  . Smoking status: Former Games developer  . Smokeless tobacco: Never Used  Vaping Use  . Vaping Use: Every day  Substance and Sexual Activity  . Alcohol use: No  . Drug use: No  . Sexual activity: Not on file

## 2021-08-25 ENCOUNTER — Ambulatory Visit: Payer: Self-pay

## 2021-08-25 ENCOUNTER — Ambulatory Visit: Payer: Medicare Other | Admitting: Orthopaedic Surgery

## 2021-08-25 ENCOUNTER — Ambulatory Visit: Payer: Medicare Other

## 2021-08-25 ENCOUNTER — Other Ambulatory Visit: Payer: Self-pay

## 2021-08-25 VITALS — BP 119/80 | HR 102

## 2021-08-25 DIAGNOSIS — M25552 Pain in left hip: Secondary | ICD-10-CM

## 2021-08-25 DIAGNOSIS — G8929 Other chronic pain: Secondary | ICD-10-CM | POA: Diagnosis not present

## 2021-08-25 DIAGNOSIS — M25562 Pain in left knee: Secondary | ICD-10-CM | POA: Diagnosis not present

## 2021-08-25 DIAGNOSIS — M1712 Unilateral primary osteoarthritis, left knee: Secondary | ICD-10-CM | POA: Diagnosis not present

## 2021-08-25 MED ORDER — LIDOCAINE HCL 1 % IJ SOLN
0.5000 mL | INTRAMUSCULAR | Status: AC | PRN
  Administered 2021-08-25: .5 mL

## 2021-08-25 MED ORDER — METHYLPREDNISOLONE ACETATE 40 MG/ML IJ SUSP
40.0000 mg | INTRAMUSCULAR | Status: AC | PRN
  Administered 2021-08-25: 40 mg via INTRA_ARTICULAR

## 2021-08-25 MED ORDER — BUPIVACAINE HCL 0.5 % IJ SOLN
3.0000 mL | INTRAMUSCULAR | Status: AC | PRN
  Administered 2021-08-25: 3 mL via INTRA_ARTICULAR

## 2021-08-25 NOTE — Progress Notes (Signed)
Office Visit Note   Patient: Morgan Owens           Date of Birth: 05/17/1966           MRN: [id number] Visit Date: 08/25/2021              Requested by: No referring provider defined for this encounter. PCP: Pcp, No   Assessment & Plan: Visit Diagnoses:  1. Pain in left hip   2. Chronic pain of left knee     Plan: Left knee injection performed we will recheck her in 1 month.She was walking without limp post knee injection.   Follow-Up Instructions: No follow-ups on file.   Orders:  Orders Placed This Encounter  Procedures   Large Joint Inj   XR HIP UNILAT W OR W/O PELVIS 1V LEFT   XR Knee 1-2 Views Left   No orders of the defined types were placed in this encounter.     Procedures: Large Joint Inj: L knee on 08/25/2021 9:22 AM Indications: joint swelling and pain Details: 22 G 1.5 in needle, anterolateral approach  Arthrogram: No  Medications: 0.5 mL lidocaine 1 %; 3 mL bupivacaine 0.5 %; 40 mg methylPREDNISolone acetate 40 MG/ML Outcome: tolerated well, no immediate complications Procedure, treatment alternatives, risks and benefits explained, specific risks discussed. Consent was given by the patient. Immediately prior to procedure a time out was called to verify the correct patient, procedure, equipment, support staff and site/side marked as required. Patient was prepped and draped in the usual sterile fashion.      Clinical Data: No additional findings.   Subjective: Chief Complaint  Patient presents with   Left Knee - Pain   Left Hip - Pain    HPI 56 year old female previously treated by me for two-level cervical discectomy with C6 corpectomy and fusion treated by me September 2018.  Letter treated 02/26/2020 for lateral malleolar fracture.  Patient was nonweightbearing for this with healing.  She is not had pain in her buttocks and her left knee and hip region with ambulation with a limp she states knee feels better with a compressive sleeve.   She occasionally has swelling.  She has been on pain management and currently is on buprenorphine naloxone 2-0.5 mg films.  Patient states pain in her knee is gradually getting worse.  No problems with numbness or tingling in her feet.  Denies claudication symptoms she ambulates with a limp.  Patient states spinal cord stimulator has been discussed with her.  Care everywhere report shows previous MRI showed severe stenosis at L4-5 in the past and about a 20 degree lumbar curvature.  Review of Systems all the systems noncontributory to HPI.   Objective: Vital Signs: BP 119/80    Pulse (!) 102   Physical Exam Constitutional:      Appearance: She is well-developed.  HENT:     Head: Normocephalic.     Right Ear: External ear normal.     Left Ear: External ear normal. There is no impacted cerumen.  Eyes:     Pupils: Pupils are equal, round, and reactive to light.  Neck:     Thyroid: No thyromegaly.     Trachea: No tracheal deviation.  Cardiovascular:     Rate and Rhythm: Normal rate.  Pulmonary:     Effort: Pulmonary effort is normal.  Abdominal:     Palpations: Abdomen is soft.  Musculoskeletal:     Cervical back: No rigidity.  Skin:    General: Skin is  warm and dry.  Neurological:     Mental Status: She is alert and oriented to person, place, and time.  Psychiatric:        Behavior: Behavior normal.   Negative logroll hips well-healed lumbar incision from surgery and adjuvant in [place] at L5-S1.  No sciatic notch tenderness no trochanteric bursal tenderness no pain with internal or external rotation of the hips.  Tenderness lateral joint line left knee no medial tenderness she is amatory with a knee limp. Ortho Exam  Specialty Comments:  No specialty comments available.  Imaging: XR HIP UNILAT W OR W/O PELVIS 1V LEFT  Result Date: 08/25/2021 AP pelvis frog-leg left hip demonstrates maintained joint space right left hip.  Lumbar curvature is noted with facet degeneration.   Slight calcification of the distal iliopsoas tendon noted.  Femoral neck is normal. Impression: Normal hip joints right and left.  Negative for acute changes left hip.  XR Knee 1-2 Views Left  Result Date: 08/25/2021 Standing AP both knees lateral left knee demonstrates mild lateral osteophytes with subchondral sclerosis consistent with mild osteoarthritis of the knee more in the lateral than medial compartment. Impression: Mild left knee osteoarthritis.    PMFS History: Patient Active Problem List   Diagnosis Date Noted   Lateral malleolar fracture 02/26/2020   Status post cervical spinal fusion 06/23/2017   Cervical spinal stenosis 04/25/2017   Past Medical History:  Diagnosis Date   Anxiety    Diabetes mellitus without complication (HCC)     Family History  Problem Relation Age of Onset   Colon cancer Mother    Heart disease Mother    Diabetes Mother    Diabetes Father    Atrial fibrillation Father     Past Surgical History:  Procedure Laterality Date   ANTERIOR CERVICAL DECOMP/DISCECTOMY FUSION N/A 04/25/2017   Procedure: C5-6, C6-7 Discectomy, C6 Corpectomy, Fusion C5-C7 Iliac Crest Allograft;  Surgeon: Eldred Manges, MD;  Location: [facility] OR;  Service: Orthopedics;  Laterality: N/A;   BICEPS TENDON REPAIR     CARPAL TUNNEL RELEASE Right 04/25/2017   Procedure: RIGHT CARPAL TUNNEL RELEASE;  Surgeon: Eldred Manges, MD;  Location: [facility] OR;  Service: Orthopedics;  Laterality: Right;   CESAREAN SECTION     CHOLECYSTECTOMY     COLONOSCOPY     GASTRIC BYPASS     HERNIA REPAIR     ROTATOR CUFF REPAIR     Social History   Occupational History   Not on file  Tobacco Use   Smoking status: Former   Smokeless tobacco: Never  Vaping Use   Vaping Use: Every day  Substance and Sexual Activity   Alcohol use: No   Drug use: No   Sexual activity: Not on file

## 2021-09-29 ENCOUNTER — Encounter: Payer: Self-pay | Admitting: Orthopaedic Surgery

## 2021-09-29 ENCOUNTER — Other Ambulatory Visit: Payer: Self-pay

## 2021-09-29 ENCOUNTER — Ambulatory Visit (INDEPENDENT_AMBULATORY_CARE_PROVIDER_SITE_OTHER): Payer: Medicare Other | Admitting: Orthopaedic Surgery

## 2021-09-29 DIAGNOSIS — M2392 Unspecified internal derangement of left knee: Secondary | ICD-10-CM | POA: Insufficient documentation

## 2021-09-29 NOTE — Progress Notes (Signed)
Office Visit Note   Patient: Morgan Owens           Date of Birth: [birth year]           MRN: [id number] Visit Date: 09/29/2021              Requested by: No referring provider defined for this encounter. PCP: Pcp, No   Assessment & Plan: Visit Diagnoses:  1. Knee locking, left     Plan: Patient had persistent mechanical symptoms despite anti-inflammatories home exercise program topical creams, intra-articular injection.  We will proceed with an MRI scan to rule out mechanical meniscal tear or cartilage flap tear with her repetitive catching of the left knee.  Office follow-up after scan.  Follow-Up Instructions: No follow-ups on file.   Orders:  No orders of the defined types were placed in this encounter.  No orders of the defined types were placed in this encounter.     Procedures: No procedures performed   Clinical Data: No additional findings.   Subjective: Chief Complaint  Patient presents with   Left Knee - Pain    HPI 56 year old female post knee injection 08/25/2021 with good relief for 3 weeks and then recurrence of symptoms pain difficulty walking and she states her left knee feels unstable.  She has been wearing a knee brace.  She has to hang onto the rail when she goes upstairs because there feels that her knee will grab or catch.  She has pain posteriorly and medially.  She describes repetitive catching but she has not actually had a walk where she could not continue to flex or extend.  She states with turning twisting and stairs type activities is most likely to have the catching sensation.  Review of Systems previous cervical fusion doing well history of lateral ankle fracture.   Objective: Vital Signs: BP 127/81    Pulse (!) 101    Ht 5\' 8"  (1.727 m)    Wt 227 lb (103 kg)    BMI 34.52 kg/m   Physical Exam Constitutional:      Appearance: She is well-developed.  HENT:     Head: Normocephalic.     Right Ear: External ear normal.     Left  Ear: External ear normal. There is no impacted cerumen.  Eyes:     Pupils: Pupils are equal, round, and reactive to light.  Neck:     Thyroid: No thyromegaly.     Trachea: No tracheal deviation.  Cardiovascular:     Rate and Rhythm: Normal rate.  Pulmonary:     Effort: Pulmonary effort is normal.  Abdominal:     Palpations: Abdomen is soft.  Musculoskeletal:     Cervical back: No rigidity.  Skin:    General: Skin is warm and dry.  Neurological:     Mental Status: She is alert and oriented to person, place, and time.  Psychiatric:        Behavior: Behavior normal.    Ortho Exam negative pivot shift anterior medial and posterior medial joint line tenderness.  Pain with hyperextension.  Negative logroll the hips knee and ankle jerk are intact patellar tendon quad tendon is normal.  Some crepitus with patellar loading and quadriceps contracture.  Specialty Comments:  No specialty comments available.  Imaging: No results found.   PMFS History: Patient Active Problem List   Diagnosis Date Noted   Knee locking, left 09/29/2021   Lateral malleolar fracture 02/26/2020   Status post cervical spinal fusion 06/23/2017  Cervical spinal stenosis 04/25/2017   Past Medical History:  Diagnosis Date   Anxiety    Diabetes mellitus without complication (HCC)     Family History  Problem Relation Age of Onset   Colon cancer Mother    Heart disease Mother    Diabetes Mother    Diabetes Father    Atrial fibrillation Father     Past Surgical History:  Procedure Laterality Date   ANTERIOR CERVICAL DECOMP/DISCECTOMY FUSION N/A 04/25/2017   Procedure: C5-6, C6-7 Discectomy, C6 Corpectomy, Fusion C5-C7 Iliac Crest Allograft;  Surgeon: Eldred Manges, MD;  Location: [facility] OR;  Service: Orthopedics;  Laterality: N/A;   BICEPS TENDON REPAIR     CARPAL TUNNEL RELEASE Right 04/25/2017   Procedure: RIGHT CARPAL TUNNEL RELEASE;  Surgeon: Eldred Manges, MD;  Location: [facility] OR;  Service: Orthopedics;   Laterality: Right;   CESAREAN SECTION     CHOLECYSTECTOMY     COLONOSCOPY     GASTRIC BYPASS     HERNIA REPAIR     ROTATOR CUFF REPAIR     Social History   Occupational History   Not on file  Tobacco Use   Smoking status: Former   Smokeless tobacco: Never  Vaping Use   Vaping Use: Every day  Substance and Sexual Activity   Alcohol use: No   Drug use: No   Sexual activity: Not on file

## 2021-10-14 ENCOUNTER — Encounter: Payer: Self-pay | Admitting: *Deleted

## 2021-11-11 ENCOUNTER — Ambulatory Visit
Admission: RE | Admit: 2021-11-11 | Discharge: 2021-11-11 | Disposition: A | Discharge status: Home / Self Care | Payer: Medicare Other | Source: Ambulatory Visit | Attending: Orthopaedic Surgery | Admitting: Orthopaedic Surgery

## 2021-11-11 DIAGNOSIS — M2392 Unspecified internal derangement of left knee: Secondary | ICD-10-CM

## 2021-11-24 ENCOUNTER — Encounter: Payer: Self-pay | Admitting: Orthopaedic Surgery

## 2021-11-24 ENCOUNTER — Ambulatory Visit: Payer: Medicare Other | Admitting: Orthopaedic Surgery

## 2021-11-24 DIAGNOSIS — S83289A Other tear of lateral meniscus, current injury, unspecified knee, initial encounter: Secondary | ICD-10-CM | POA: Insufficient documentation

## 2021-11-24 DIAGNOSIS — S83282D Other tear of lateral meniscus, current injury, left knee, subsequent encounter: Secondary | ICD-10-CM

## 2021-11-24 MED ORDER — BUPIVACAINE HCL 0.5 % IJ SOLN
3.0000 mL | INTRAMUSCULAR | Status: AC | PRN
  Administered 2021-11-24: 3 mL via INTRA_ARTICULAR

## 2021-11-24 MED ORDER — LIDOCAINE HCL 1 % IJ SOLN
0.5000 mL | INTRAMUSCULAR | Status: AC | PRN
  Administered 2021-11-24: .5 mL

## 2021-11-24 MED ORDER — METHYLPREDNISOLONE ACETATE 40 MG/ML IJ SUSP
40.0000 mg | INTRAMUSCULAR | Status: AC | PRN
  Administered 2021-11-24: 40 mg via INTRA_ARTICULAR

## 2021-11-24 NOTE — Progress Notes (Signed)
? ?Office Visit Note ?  ?Patient: Morgan Owens           ?Date of Birth: 07/11/1966           ?MRN: [id number] ?Visit Date: 11/24/2021 ?             ?Requested by: No referring provider defined for this encounter. ?PCP: Pcp, No ? ? ?Assessment & Plan: ?Visit Diagnoses:  ?1. Tear of lateral meniscus of left knee, unspecified tear type, unspecified whether old or current tear, subsequent encounter   ? ? ?Plan: Repeat injection left knee performed we discussed options including knee arthroscopy and partial lateral meniscectomy for lateral meniscal tear.  We will check her back again in 8 weeks if she is having persistent problems we can reconsider knee arthroscopy. ? ?Follow-Up Instructions: Return in about 6 weeks (around 01/05/2022).  ? ?Orders:  ?Orders Placed This Encounter  ?Procedures  ? Large Joint Inj  ? ?No orders of the defined types were placed in this encounter. ? ? ? ? Procedures: ?Large Joint Inj: L knee on 11/24/2021 9:26 AM ?Indications: joint swelling and pain ?Details: 22 G 1.5 in needle, anterolateral approach ? ?Arthrogram: No ? ?Medications: 0.5 mL lidocaine 1 %; 3 mL bupivacaine 0.5 %; 40 mg methylPREDNISolone acetate 40 MG/ML ?Outcome: tolerated well, no immediate complications ?Procedure, treatment alternatives, risks and benefits explained, specific risks discussed. Consent was given by the patient. Immediately prior to procedure a time out was called to verify the correct patient, procedure, equipment, support staff and site/side marked as required. Patient was prepped and draped in the usual sterile fashion.  ? ? ? ? ?Clinical Data: ?No additional findings. ? ? ?Subjective: ?Chief Complaint  ?Patient presents with  ? Left Knee - Follow-up  ?  MRI review  ? ? ?HPI [age] year old female returns ongoing problems with left knee pain.  She states she did get relief for about a month with the injection.  MRI scan is available for review which shows some degenerative changes but diminutive  anterior lateral meniscus consistent with old chronic tear and tearing of the posterior aspect of the meniscus.  She has repetitive catching locking of her knee.  She thinks might be related to when she fell 3 years ago and injured her back.  She has been on chronic pain management currently is on the film.  In the past she is on hydrocodone and oxycodone. ? ?Review of Systems updated unchanged. ? ? ?Objective: ?Vital Signs: BP 114/70   Pulse 75   Ht 5' 7.75" (1.721 m)   Wt 183 lb (83 kg)   BMI 28.03 kg/m?  ? ?Physical Exam ?Constitutional:   ?   Appearance: She is well-developed.  ?HENT:  ?   Head: Normocephalic.  ?   Right Ear: External ear normal.  ?   Left Ear: External ear normal. There is no impacted cerumen.  ?Eyes:  ?   Pupils: Pupils are equal, round, and reactive to light.  ?Neck:  ?   Thyroid: No thyromegaly.  ?   Trachea: No tracheal deviation.  ?Cardiovascular:  ?   Rate and Rhythm: Normal rate.  ?Pulmonary:  ?   Effort: Pulmonary effort is normal.  ?Abdominal:  ?   Palpations: Abdomen is soft.  ?Musculoskeletal:  ?   Cervical back: No rigidity.  ?Skin: ?   General: Skin is warm and dry.  ?Neurological:  ?   Mental Status: She is alert and oriented to person, place, and time.  ?Psychiatric:     ?  Behavior: Behavior normal.  ? ? ?Ortho Exam patient has some lateral joint line tenderness.  Slight fullness popliteal region knee comes out full extension she is amatory with Velcro ankle brace.  Negative logroll the hips.  No rash or exposed skin. ? ?Specialty Comments:  ?No specialty comments available. ? ?Imaging: ?CLINICAL DATA:  Left lateral joint pain with catching for 3 years. ?  ?EXAM: ?MRI OF THE LEFT KNEE WITHOUT CONTRAST ?  ?TECHNIQUE: ?Multiplanar, multisequence MR imaging of the knee was performed. No ?intravenous contrast was administered. ?  ?COMPARISON:  X-ray knee 08/25/2021. ?  ?FINDINGS: ?MENISCI ?  ?Medial: Intrasubstance degeneration without definitive medial ?meniscus tear. ?   ?Lateral: There is intrasubstance degeneration with free edge fraying ?of the posterior horn and body of the lateral meniscus with possible ?nondisplaced horizontal tear at the posterior horn-body junction ?(coronal PD image 9, sagittal PD image 7). Diminutive anterior horn ?of the lateral meniscus likely due to degenerative tearing. ?  ?LIGAMENTS ?  ?Cruciates: Mucoid degeneration of the ACL.  The PCL is intact. ?  ?Collaterals: Medial collateral ligament is intact. Lateral ?collateral ligament complex is intact. ?  ?CARTILAGE ?  ?Patellofemoral:  No chondral defect. ?  ?Medial:  Low-grade partial-thickness cartilage loss. ?  ?Lateral: Areas of intermediate grade partial-thickness cartilage ?loss along the weight-bearing surfaces. ?  ?JOINT: Large joint effusion with nonspecific synovitis. ?  ?POPLITEAL FOSSA: Miniscule Baker cyst. ?  ?EXTENSOR MECHANISM: Intact quadriceps tendon. Intact patellar ?tendon. ?  ?BONES: Tricompartment osteophyte formation. No acute fracture or ?dislocation. No aggressive osseous lesion. ?  ?Other: Ganglion cyst formation along the gastrocnemius origins. No ?other acute findings. ?  ?IMPRESSION: ?Free edge fraying of the posterior horn and body of the lateral ?meniscus with possible nondisplaced horizontal tear at the posterior ?horn-body junction. Diminutive anterior horn of the lateral meniscus ?likely due to degenerative tearing. ?  ?Mucoid degeneration of the ACL. ?  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 ?  ?Large joint effusion with nonspecific synovitis. Miniscule Baker ?cyst. ?  ?  ?Electronically Signed ?  By: Caprice Renshaw M.D. ?  On: 11/11/2021 16:45 ? ? ?PMFS History: ?Patient Active Problem List  ? Diagnosis Date Noted  ? Lateral meniscal tear 11/24/2021  ? Knee locking, left 09/29/2021  ? Lateral malleolar fracture 02/26/2020  ? Status post cervical spinal fusion 06/23/2017  ? Cervical spinal stenosis 04/25/2017  ? ?Past Medical History:   ?Diagnosis Date  ? Anxiety   ? Diabetes mellitus without complication (HCC)   ?  ?Family History  ?Problem Relation Age of Onset  ? Colon cancer Mother   ? Heart disease Mother   ? Diabetes Mother   ? Diabetes Father   ? Atrial fibrillation Father   ?  ?Past Surgical History:  ?Procedure Laterality Date  ? ANTERIOR CERVICAL DECOMP/DISCECTOMY FUSION N/A 04/25/2017  ? Procedure: C5-6, C6-7 Discectomy, C6 Corpectomy, Fusion C5-C7 Iliac Crest Allograft;  Surgeon: Eldred Manges, MD;  Location: [facility] OR;  Service: Orthopedics;  Laterality: N/A;  ? BICEPS TENDON REPAIR    ? CARPAL TUNNEL RELEASE Right 04/25/2017  ? Procedure: RIGHT CARPAL TUNNEL RELEASE;  Surgeon: Eldred Manges, MD;  Location: [facility] OR;  Service: Orthopedics;  Laterality: Right;  ? CESAREAN SECTION    ? CHOLECYSTECTOMY    ? COLONOSCOPY    ? GASTRIC BYPASS    ? HERNIA REPAIR    ? ROTATOR CUFF REPAIR    ? ?Social History  ? ?Occupational History  ? Not  on file  ?Tobacco Use  ? Smoking status: Former  ? Smokeless tobacco: Never  ?Vaping Use  ? Vaping Use: Every day  ?Substance and Sexual Activity  ? Alcohol use: No  ? Drug use: No  ? Sexual activity: Not on file  ? ? ? ? ? ? ?

## 2022-01-19 ENCOUNTER — Ambulatory Visit: Payer: Medicare Other | Admitting: Orthopaedic Surgery

## 2022-01-19 ENCOUNTER — Encounter: Payer: Self-pay | Admitting: Orthopaedic Surgery

## 2022-01-19 VITALS — BP 135/82 | HR 90 | Ht 67.75 in | Wt 171.4 lb

## 2022-01-19 DIAGNOSIS — S83282D Other tear of lateral meniscus, current injury, left knee, subsequent encounter: Secondary | ICD-10-CM | POA: Diagnosis not present

## 2022-01-19 NOTE — Progress Notes (Signed)
Office Visit Note   Patient: Morgan Owens           Date of Birth: November 13, 1965           MRN: [id number] Visit Date: 01/19/2022              Requested by: No referring provider defined for this encounter. PCP: Pcp, No   Assessment & Plan: Visit Diagnoses:  1. Tear of lateral meniscus of left knee, unspecified tear type, unspecified whether old or current tear, subsequent encounter     Plan: Patient was persistence lateral meniscal tear symptoms with intermittent catching but fortunately she has been able to grab objects to prevent falling.  Home exercise program, intra-articular injection, anti-inflammatories have been ineffective for helping her symptoms.  Patient states she like to proceed with outpatient arthroscopy for partial lateral meniscectomy of the left knee.  Procedure discussed.  Her husband is at home to help her postoperatively.  Risks of surgery discussed home exercise program after surgery.  Questions were elicited and answered.  She requests we proceed.  Follow-Up Instructions: No follow-ups on file.   Orders:  No orders of the defined types were placed in this encounter.  No orders of the defined types were placed in this encounter.     Procedures: No procedures performed   Clinical Data: No additional findings.   Subjective: Chief Complaint  Patient presents with   Left Knee - Pain, Follow-up    HPI 55 year old female returns with ongoing problems with her right knee.  She had previous injection in April and states her knee is not really any better and she still has to grab hold objects with turning and she occasionally has grabbing and catching in her knee.  She has used a knee brace, taken anti-inflammatories.  Failed injection.  Continues to occasionally have locking in her knee but has been able to grab objects to avoid falling to the ground.  MRI scan showed lateral meniscal tear and some lateral compartment chondromalacia.  Patient states all  her medications for diabetes has been stopped as well as Percocet and Norco.  She is now using buprenorphine naloxone films. Review of Systems history of weight loss greater than 50 pounds.  Previous two-level cervical fusion C5-C7 2018 for stenosis doing well.   Objective: Vital Signs: BP 135/82   Pulse 90   Ht 5' 7.75" (1.721 m)   Wt 171 lb 6.4 oz (77.7 kg)   BMI 26.25 kg/m   Physical Exam Constitutional:      Appearance: She is well-developed.  HENT:     Head: Normocephalic.     Right Ear: External ear normal.     Left Ear: External ear normal. There is no impacted cerumen.  Eyes:     Pupils: Pupils are equal, round, and reactive to light.  Neck:     Thyroid: No thyromegaly.     Trachea: No tracheal deviation.  Cardiovascular:     Rate and Rhythm: Normal rate.  Pulmonary:     Effort: Pulmonary effort is normal.  Abdominal:     Palpations: Abdomen is soft.  Musculoskeletal:     Cervical back: No rigidity.  Skin:    General: Skin is warm and dry.  Neurological:     Mental Status: She is alert and oriented to person, place, and time.  Psychiatric:        Behavior: Behavior normal.     Ortho Exam left knee ACL PCL exam is normal negative logroll of  the hips no pain with internal/external rotation negative straight leg raising.  There is lateral joint line tenderness left knee no tenderness on the medial joint line.  Pain with hyperextension at the lateral joint line.  Specialty Comments:  No specialty comments available.  Imaging: CLINICAL DATA:  Left lateral joint pain with catching for 3 years.   EXAM: MRI OF THE LEFT KNEE WITHOUT CONTRAST   TECHNIQUE: Multiplanar, multisequence MR imaging of the knee was performed. No intravenous contrast was administered.   COMPARISON:  X-ray knee 08/25/2021.   FINDINGS: MENISCI   Medial: Intrasubstance degeneration without definitive medial meniscus tear.   Lateral: There is intrasubstance degeneration with free  edge fraying of the posterior horn and body of the lateral meniscus with possible nondisplaced horizontal tear at the posterior horn-body junction (coronal PD image 9, sagittal PD image 7). Diminutive anterior horn of the lateral meniscus likely due to degenerative tearing.   LIGAMENTS   Cruciates: Mucoid degeneration of the ACL.  The PCL is intact.   Collaterals: Medial collateral ligament is intact. Lateral collateral ligament complex is intact.   CARTILAGE   Patellofemoral:  No chondral defect.   Medial:  Low-grade partial-thickness cartilage loss.   Lateral: Areas of intermediate grade partial-thickness cartilage loss along the weight-bearing surfaces.   JOINT: Large joint effusion with nonspecific synovitis.   POPLITEAL FOSSA: Miniscule Baker cyst.   EXTENSOR MECHANISM: Intact quadriceps tendon. Intact patellar tendon.   BONES: Tricompartment osteophyte formation. No acute fracture or dislocation. No aggressive osseous lesion.   Other: Ganglion cyst formation along the gastrocnemius origins. No other acute findings.   IMPRESSION: Free edge fraying of the posterior horn and body of the lateral meniscus with possible nondisplaced horizontal tear at the posterior horn-body junction. Diminutive anterior horn of the lateral meniscus likely due to degenerative tearing.   Mucoid degeneration of the ACL.   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   Large joint effusion with nonspecific synovitis. Miniscule Baker cyst.     Electronically Signed   By: Maurine Simmering M.D.   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   Lateral malleolar fracture 02/26/2020   Status post cervical spinal fusion 06/23/2017   Cervical spinal stenosis 04/25/2017   Past Medical History:  Diagnosis Date   Anxiety    Diabetes mellitus without  complication ([place])     Family History  Problem Relation Age of Onset   Colon cancer Mother    Heart disease Mother    Diabetes Mother    Diabetes Father    Atrial fibrillation Father     Past Surgical History:  Procedure Laterality Date   ANTERIOR CERVICAL DECOMP/DISCECTOMY FUSION N/A 04/25/2017   Procedure: C5-6, C6-7 Discectomy, C6 Corpectomy, Fusion C5-C7 Iliac Crest Allograft;  Surgeon: Marybelle Killings, MD;  Location: [facility];  Service: Orthopedics;  Laterality: N/A;   BICEPS TENDON REPAIR     CARPAL TUNNEL RELEASE Right 04/25/2017   Procedure: RIGHT CARPAL TUNNEL RELEASE;  Surgeon: Marybelle Killings, MD;  Location: [facility];  Service: Orthopedics;  Laterality: Right;   CESAREAN SECTION     CHOLECYSTECTOMY     COLONOSCOPY     GASTRIC BYPASS     HERNIA REPAIR     ROTATOR CUFF REPAIR     Social History   Occupational History   Not on file  Tobacco Use  Smoking status: Former   Smokeless tobacco: Never  Scientific laboratory technician Use: Every day  Substance and Sexual Activity   Alcohol use: No   Drug use: No   Sexual activity: Not on file

## 2022-01-25 ENCOUNTER — Encounter: Payer: Self-pay | Admitting: Orthopaedic Surgery

## 2022-01-25 ENCOUNTER — Other Ambulatory Visit: Payer: Self-pay | Admitting: Orthopaedic Surgery

## 2022-01-25 MED ORDER — HYDROCODONE-ACETAMINOPHEN 5-325 MG PO TABS: 1.0000 | ORAL_TABLET | Freq: Four times a day (QID) | ORAL | 0 refills | Status: AC | PRN

## 2022-02-02 ENCOUNTER — Ambulatory Visit (INDEPENDENT_AMBULATORY_CARE_PROVIDER_SITE_OTHER): Payer: Medicare Other | Admitting: Orthopaedic Surgery

## 2022-02-02 ENCOUNTER — Encounter: Payer: Self-pay | Admitting: Orthopaedic Surgery

## 2022-02-02 VITALS — BP 126/76 | HR 69 | Ht 67.0 in | Wt 171.4 lb

## 2022-02-02 DIAGNOSIS — S83282D Other tear of lateral meniscus, current injury, left knee, subsequent encounter: Secondary | ICD-10-CM

## 2022-02-02 NOTE — Progress Notes (Signed)
   Post-Op Visit Note   Patient: Morgan Owens           Date of Birth: April 12, 1966           MRN: [id number] Visit Date: 02/02/2022 PCP: Pcp, No   Assessment & Plan: Postop knee arthroscopy left knee partial lateral meniscectomy.  Swelling is down Steri-Strips changed incision looks good she is amatory without any walking aids she has near full extension flexing to 100 degrees easily.  She will work on leg lifts knee flexion knee extension and progressive activity.  Continue ice and elevation for pain as needed.  Recheck 1 month.  Patient is happy with the results of surgery.  She states she is no longer having any locking or catching.  Chief Complaint:  Chief Complaint  Patient presents with   Left Knee - Routine Post Op   Visit Diagnoses:  1. Tear of lateral meniscus of left knee, unspecified tear type, unspecified whether old or current tear, subsequent encounter     Plan: Continue progress postop with activities recheck for final visit 1 month.  Follow-Up Instructions: Return in about 1 month (around 03/04/2022).   Orders:  No orders of the defined types were placed in this encounter.  No orders of the defined types were placed in this encounter.   Imaging: No results found.  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   Lateral malleolar fracture 02/26/2020   Status post cervical spinal fusion 06/23/2017   Cervical spinal stenosis 04/25/2017   Past Medical History:  Diagnosis Date   Anxiety    Diabetes mellitus without complication ([facility])     Family History  Problem Relation Age of Onset   Colon cancer Mother    Heart disease Mother    Diabetes Mother    Diabetes Father    Atrial fibrillation Father     Past Surgical History:  Procedure Laterality Date   ANTERIOR CERVICAL DECOMP/DISCECTOMY FUSION N/A 04/25/2017   Procedure: C5-6, C6-7 Discectomy, C6 Corpectomy,  Fusion C5-C7 Iliac Crest Allograft;  Surgeon: Eldred Manges, MD;  Location: [facility];  Service: Orthopedics;  Laterality: N/A;   BICEPS TENDON REPAIR     CARPAL TUNNEL RELEASE Right 04/25/2017   Procedure: RIGHT CARPAL TUNNEL RELEASE;  Surgeon: Eldred Manges, MD;  Location: [facility];  Service: Orthopedics;  Laterality: Right;   CESAREAN SECTION     CHOLECYSTECTOMY     COLONOSCOPY     GASTRIC BYPASS     HERNIA REPAIR     ROTATOR CUFF REPAIR     Social History   Occupational History   Not on file  Tobacco Use   Smoking status: Former   Smokeless tobacco: Never  Vaping Use   Vaping Use: Every day  Substance and Sexual Activity   Alcohol use: No   Drug use: No   Sexual activity: Not on file

## 2022-03-09 ENCOUNTER — Ambulatory Visit (INDEPENDENT_AMBULATORY_CARE_PROVIDER_SITE_OTHER): Payer: Medicare Other | Admitting: Orthopaedic Surgery

## 2022-03-09 ENCOUNTER — Encounter: Payer: Self-pay | Admitting: Orthopaedic Surgery

## 2022-03-09 VITALS — BP 101/64 | HR 80 | Ht 67.0 in | Wt 171.0 lb

## 2022-03-09 DIAGNOSIS — S83282D Other tear of lateral meniscus, current injury, left knee, subsequent encounter: Secondary | ICD-10-CM

## 2022-03-09 NOTE — Progress Notes (Signed)
   Post-Op Visit Note   Patient: Morgan Owens           Date of Birth: 15-Aug-1965           MRN: [id number] Visit Date: 03/09/2022 PCP: Pcp, No   Assessment & Plan: Postop knee arthroscopy partial meniscectomy.  States sometimes the knee feels a little bit stiff when she first gets up she has joined a pool and is doing water therapy.  She is happy with results of surgery.  Chief Complaint:  Chief Complaint  Patient presents with   Left Knee - Routine Post Op   Visit Diagnoses:  1. Tear of lateral meniscus of left knee, unspecified tear type, unspecified whether old or current tear, subsequent encounter     Plan: Good flexion extension good quad strength.  She is able to ambulate after some initial stiffness when she first gets up with normal gait pattern nice stride length no limping.  She can follow-up as needed for her knee.  Follow-Up Instructions: No follow-ups on file.   Orders:  No orders of the defined types were placed in this encounter.  No orders of the defined types were placed in this encounter.   Imaging: No results found.  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   Lateral malleolar fracture 02/26/2020   Status post cervical spinal fusion 06/23/2017   Cervical spinal stenosis 04/25/2017   Past Medical History:  Diagnosis Date   Anxiety    Diabetes mellitus without complication (HCC)     Family History  Problem Relation Age of Onset   Colon cancer Mother    Heart disease Mother    Diabetes Mother    Diabetes Father    Atrial fibrillation Father     Past Surgical History:  Procedure Laterality Date   ANTERIOR CERVICAL DECOMP/DISCECTOMY FUSION N/A 04/25/2017   Procedure: C5-6, C6-7 Discectomy, C6 Corpectomy, Fusion C5-C7 Iliac Crest Allograft;  Surgeon: Eldred Manges, MD;  Location: [facility];  Service: Orthopedics;  Laterality: N/A;   BICEPS TENDON REPAIR      CARPAL TUNNEL RELEASE Right 04/25/2017   Procedure: RIGHT CARPAL TUNNEL RELEASE;  Surgeon: Eldred Manges, MD;  Location: [facility];  Service: Orthopedics;  Laterality: Right;   CESAREAN SECTION     CHOLECYSTECTOMY     COLONOSCOPY     GASTRIC BYPASS     HERNIA REPAIR     ROTATOR CUFF REPAIR     Social History   Occupational History   Not on file  Tobacco Use   Smoking status: Former   Smokeless tobacco: Never  Vaping Use   Vaping Use: Every day  Substance and Sexual Activity   Alcohol use: No   Drug use: No   Sexual activity: Not on file
